# Patient Record
Sex: Female | Born: 1950 | Race: Black or African American | Hispanic: No | Marital: Married | Smoking: Never smoker
Health system: Southern US, Community
[De-identification: ages and names within clinical notes are randomized; demographics above are authoritative.]

## PROBLEM LIST (undated history)

## (undated) DIAGNOSIS — R413 Other amnesia: Secondary | ICD-10-CM

## (undated) DIAGNOSIS — S060X9A Concussion with loss of consciousness of unspecified duration, initial encounter: Secondary | ICD-10-CM

## (undated) DIAGNOSIS — G43909 Migraine, unspecified, not intractable, without status migrainosus: Secondary | ICD-10-CM

## (undated) DIAGNOSIS — S060XAA Concussion with loss of consciousness status unknown, initial encounter: Secondary | ICD-10-CM

## (undated) DIAGNOSIS — R51 Headache: Secondary | ICD-10-CM

## (undated) DIAGNOSIS — F32A Depression, unspecified: Secondary | ICD-10-CM

## (undated) DIAGNOSIS — E7800 Pure hypercholesterolemia, unspecified: Secondary | ICD-10-CM

## (undated) DIAGNOSIS — F329 Major depressive disorder, single episode, unspecified: Secondary | ICD-10-CM

## (undated) DIAGNOSIS — F0390 Unspecified dementia without behavioral disturbance: Secondary | ICD-10-CM

## (undated) HISTORY — DX: Pure hypercholesterolemia, unspecified: E78.00

## (undated) HISTORY — PX: KNEE SURGERY: SHX244

## (undated) HISTORY — PX: BACK SURGERY: SHX140

## (undated) HISTORY — DX: Major depressive disorder, single episode, unspecified: F32.9

## (undated) HISTORY — DX: Migraine, unspecified, not intractable, without status migrainosus: G43.909

## (undated) HISTORY — DX: Headache: R51

## (undated) HISTORY — DX: Unspecified dementia, unspecified severity, without behavioral disturbance, psychotic disturbance, mood disturbance, and anxiety: F03.90

## (undated) HISTORY — DX: Other amnesia: R41.3

## (undated) HISTORY — PX: HAND SURGERY: SHX662

## (undated) HISTORY — DX: Depression, unspecified: F32.A

---

## 1998-07-22 ENCOUNTER — Other Ambulatory Visit: Admission: RE | Admit: 1998-07-22 | Discharge: 1998-07-22 | Payer: Self-pay | Admitting: Obstetrics & Gynecology

## 1999-02-01 ENCOUNTER — Other Ambulatory Visit: Admission: RE | Admit: 1999-02-01 | Discharge: 1999-02-01 | Payer: Self-pay | Admitting: Obstetrics and Gynecology

## 1999-07-21 ENCOUNTER — Encounter: Payer: Self-pay | Admitting: Emergency Medicine

## 1999-07-21 ENCOUNTER — Emergency Department (HOSPITAL_COMMUNITY): Admission: EM | Admit: 1999-07-21 | Discharge: 1999-07-21 | Payer: Self-pay | Admitting: Emergency Medicine

## 1999-07-27 ENCOUNTER — Ambulatory Visit (HOSPITAL_BASED_OUTPATIENT_CLINIC_OR_DEPARTMENT_OTHER): Admission: RE | Admit: 1999-07-27 | Discharge: 1999-07-27 | Payer: Self-pay | Admitting: Orthopedic Surgery

## 1999-11-08 ENCOUNTER — Other Ambulatory Visit: Admission: RE | Admit: 1999-11-08 | Discharge: 1999-11-08 | Payer: Self-pay | Admitting: Obstetrics and Gynecology

## 1999-11-09 ENCOUNTER — Other Ambulatory Visit: Admission: RE | Admit: 1999-11-09 | Discharge: 1999-11-09 | Payer: Self-pay | Admitting: Obstetrics and Gynecology

## 1999-12-02 ENCOUNTER — Ambulatory Visit (HOSPITAL_COMMUNITY): Admission: RE | Admit: 1999-12-02 | Discharge: 1999-12-02 | Payer: Self-pay | Admitting: Obstetrics and Gynecology

## 1999-12-02 ENCOUNTER — Encounter: Payer: Self-pay | Admitting: Obstetrics and Gynecology

## 2000-08-28 ENCOUNTER — Encounter: Admission: RE | Admit: 2000-08-28 | Discharge: 2000-11-26 | Payer: Self-pay | Admitting: Anesthesiology

## 2000-10-19 ENCOUNTER — Emergency Department (HOSPITAL_COMMUNITY): Admission: EM | Admit: 2000-10-19 | Discharge: 2000-10-19 | Payer: Self-pay | Admitting: Internal Medicine

## 2000-11-04 ENCOUNTER — Emergency Department (HOSPITAL_COMMUNITY): Admission: EM | Admit: 2000-11-04 | Discharge: 2000-11-04 | Payer: Self-pay | Admitting: Emergency Medicine

## 2000-11-04 ENCOUNTER — Encounter: Payer: Self-pay | Admitting: Emergency Medicine

## 2000-12-26 ENCOUNTER — Encounter: Admission: RE | Admit: 2000-12-26 | Discharge: 2000-12-26 | Payer: Self-pay | Admitting: Occupational Medicine

## 2000-12-26 ENCOUNTER — Encounter: Payer: Self-pay | Admitting: Occupational Medicine

## 2001-01-12 ENCOUNTER — Emergency Department (HOSPITAL_COMMUNITY): Admission: EM | Admit: 2001-01-12 | Discharge: 2001-01-12 | Payer: Self-pay | Admitting: Emergency Medicine

## 2001-01-12 ENCOUNTER — Encounter: Payer: Self-pay | Admitting: Emergency Medicine

## 2001-02-26 ENCOUNTER — Encounter: Admission: RE | Admit: 2001-02-26 | Discharge: 2001-02-26 | Payer: Self-pay | Admitting: Obstetrics & Gynecology

## 2001-02-28 ENCOUNTER — Encounter: Admission: RE | Admit: 2001-02-28 | Discharge: 2001-05-02 | Payer: Self-pay | Admitting: Family Medicine

## 2002-04-11 ENCOUNTER — Encounter: Payer: Self-pay | Admitting: Emergency Medicine

## 2002-04-11 ENCOUNTER — Emergency Department (HOSPITAL_COMMUNITY): Admission: EM | Admit: 2002-04-11 | Discharge: 2002-04-11 | Payer: Self-pay | Admitting: Emergency Medicine

## 2002-05-18 ENCOUNTER — Ambulatory Visit (HOSPITAL_COMMUNITY)
Admission: RE | Admit: 2002-05-18 | Discharge: 2002-05-18 | Payer: Self-pay | Admitting: Physical Medicine and Rehabilitation

## 2002-05-18 ENCOUNTER — Encounter: Payer: Self-pay | Admitting: Physical Medicine and Rehabilitation

## 2002-06-10 ENCOUNTER — Emergency Department (HOSPITAL_COMMUNITY): Admission: EM | Admit: 2002-06-10 | Discharge: 2002-06-10 | Payer: Self-pay | Admitting: Emergency Medicine

## 2004-08-19 ENCOUNTER — Encounter: Admission: RE | Admit: 2004-08-19 | Discharge: 2004-08-19 | Payer: Self-pay | Admitting: Orthopaedic Surgery

## 2004-10-17 ENCOUNTER — Other Ambulatory Visit: Admission: RE | Admit: 2004-10-17 | Discharge: 2004-10-17 | Payer: Self-pay | Admitting: Family Medicine

## 2004-10-20 ENCOUNTER — Ambulatory Visit (HOSPITAL_COMMUNITY): Admission: RE | Admit: 2004-10-20 | Discharge: 2004-10-20 | Payer: Self-pay | Admitting: Family Medicine

## 2004-10-31 ENCOUNTER — Encounter: Admission: RE | Admit: 2004-10-31 | Discharge: 2005-01-29 | Payer: Self-pay | Admitting: Family Medicine

## 2006-01-22 ENCOUNTER — Emergency Department (HOSPITAL_COMMUNITY): Admission: EM | Admit: 2006-01-22 | Discharge: 2006-01-22 | Payer: Self-pay | Admitting: Emergency Medicine

## 2006-06-23 ENCOUNTER — Emergency Department (HOSPITAL_COMMUNITY): Admission: EM | Admit: 2006-06-23 | Discharge: 2006-06-23 | Payer: Self-pay | Admitting: Emergency Medicine

## 2007-01-12 ENCOUNTER — Emergency Department (HOSPITAL_COMMUNITY): Admission: EM | Admit: 2007-01-12 | Discharge: 2007-01-12 | Payer: Self-pay | Admitting: Family Medicine

## 2007-02-13 ENCOUNTER — Encounter: Admission: RE | Admit: 2007-02-13 | Discharge: 2007-02-13 | Payer: Self-pay | Admitting: Specialist

## 2008-12-03 ENCOUNTER — Emergency Department (HOSPITAL_COMMUNITY): Admission: EM | Admit: 2008-12-03 | Discharge: 2008-12-03 | Payer: Self-pay | Admitting: Emergency Medicine

## 2008-12-07 ENCOUNTER — Emergency Department (HOSPITAL_COMMUNITY): Admission: EM | Admit: 2008-12-07 | Discharge: 2008-12-07 | Payer: Self-pay | Admitting: Emergency Medicine

## 2008-12-16 ENCOUNTER — Emergency Department (HOSPITAL_COMMUNITY): Admission: EM | Admit: 2008-12-16 | Discharge: 2008-12-17 | Payer: Self-pay | Admitting: Emergency Medicine

## 2010-10-28 NOTE — Op Note (Signed)
[facility]  Patient:    GENEVEIVE, FURNESS                       MRN: [id number] Proc. Date: 09/05/00 Adm. Date:  [id number] Attending:  Thyra Breed CC:         Garlon Hatchet.   Operative Report  PROCEDURE:  Lumbar epidural steroid injection.  DIAGNOSIS:  Lumbar degenerative disk disease.  INTERVAL HISTORY:  The patient has noted some lessening of her leg discomfort after her first injection, but continues to have a pain level of 5 out of 10. She had no untoward effects from the injection.  EXAMINATION:  VITAL SIGNS:  Blood pressure 143/69, heart rate 94, respiratory rate 15, O2 saturations 99%, pain level 5 out of 10.  BACK:  Shows good healing from a previous injection site.  DESCRIPTION OF PROCEDURE:  After an informed consent was obtained, the patient was placed in the sitting position and monitored.  Her back was prepped with Betadine x 3.  A skin wheal was raised at the L3-4 interspace with 1% lidocaine.  A 20-gauge Tuohy needle was introduced into the lumbar epidural space to loss of resistance to preservative-free normal saline.  There was no cerebral spinal fluid nor blood.  Then 40 mg of Medrol and 8 mL of preservative-free normal saline was gently injected.  The needle was flushed with preservative-free normal saline and removed intact.  POSTPROCEDURE CONDITION:  Stable.  DISCHARGE INSTRUCTIONS: 1. Resume previous diet. 2. Limitation in activities per instruction sheet. 3. Continue with current medications. 4. Follow up in one to two weeks for repeat injection. DD:  09/05/00 TD:  09/06/00 Job: [id number]

## 2010-10-28 NOTE — Op Note (Signed)
[place]  Patient:    Rhonda Peterson, Rhonda Peterson                         MRN: [id number] Proc. Date: 07/27/99 Adm. Date:  07/27/99 Attending:  Artist Pais. Mina Marble, M.D.                           Operative Report  PREOPERATIVE DIAGNOSIS:  Displaced fracture of left fifth finger proximal phalanx.  POSTOPERATIVE DIAGNOSIS:  Displaced fracture of left fifth finger proximal phalanx.  PROCEDURE:  Closed reduction and percutaneous pinning of above fracture.  SURGEON:  Artist Pais. Mina Marble, M.D.  ANESTHESIA:  General.  TOURNIQUET TIME:  Fifteen minutes.  COMPLICATIONS:  None.  DRAINS:  None.  DESCRIPTION OF PROCEDURE:  The patient was taken to the operating room and after the induction of adequate general anesthesia, the left upper extremity was prepped and draped in the usual sterile fashion.  Esmarch was used to exsanguinate the limb, and the tourniquet was inflated to 250 mmHg.  At this point in time, a closed reduction was performed with displaced fracture at the base of the fifth proximal phalanx, and under fluoroscopic guidance, two 0.035 K-wires were driven from proximal to distal in a cross fashion across the fracture site, and to maintain the reduction.  The reduction was deemed to be adequate in both AP, lateral, and oblique view.  The wires were cut outside the skin and bent upon themselves.  At this point in time, a sterile dressing of Xeroform, 4 x 4s, fluffs, and an ulnar gutter splint was applied.  The patient tolerated the procedure well and went to the recovery room in a stable fashion. DD:  10/19/99 TD:  10/21/99 Job: [id number]
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
## 2010-10-28 NOTE — Procedures (Signed)
[facility]  Patient:    Rhonda Peterson, Rhonda Peterson                       MRN: [id number] Proc. Date: 09/12/00 Adm. Date:  [id number] Attending:  Thyra Breed CC:         Donzetta Sprung. Roney Jaffe., MD                           Procedure Report  PROCEDURE PERFORMED:  Lumbar epidural steroid injection.  DIAGNOSIS:  Lumbar degenerative disk disease.  BRIEF HISTORY:  The patient has noted that her pain has progressively improved.  She rates it as 3/10.  She has had intermittent radiation of her pain out to her left foot, which comes and goes fairly infrequently.  She is here for her third injection today, which she is interested in pursuing.  PHYSICAL EXAMINATION:  Blood pressure 113/53, heart rate 105, and respiratory rate 18.  Her O2 sat is 99%.  Temperature is 97.9.  Pain level is 3/10.  Her back shows good healing from a previous injection site.  DESCRIPTION OF PROCEDURE:  After informed consent was obtained, the patient was placed in the sitting position and monitored.  Her back was prepped with Betadine x 3.  A skin wheel was raised at the L4-5 interspace with 1% lidocaine.  A 20-gauge Tuohy needle was introduced to the lumbar epidural space to loss of resistance to preservative-free normal saline.  There was no CSF or blood.  Medrol 40 mg and 8 ml of preservative-free normal saline was gently injected.  The needle was flushed with preservative free of normal saline and removed intact.  Post procedure condition is stable.  PLAN: 1. Resume previous diet. 2. Limitation activities per instruction sheet as outlined by    my assistant today. 3. Continue current medications. 4. The patient plans to follow up with Dr. Donalee Citrin. DD:  09/12/00 TD:  09/12/00 Job: [id number]

## 2010-10-28 NOTE — Op Note (Signed)
[place]  Patient:    Rhonda Peterson, Rhonda Peterson                         MRN: [id number] Proc. Date: 07/27/99 Attending:  Artist Pais. Mina Marble, M.D. CC:         Artist Pais. Weingold, M.D. x 2   Operative Report  PREOPERATIVE DIAGNOSIS:  Fracture left fifth proximal phalanx.  POSTOPERATIVE DIAGNOSIS:  Fracture left fifth proximal phalanx.  PROCEDURE:  Closed reduction and percutaneous fixation of above fracture.  SURGEON:  Artist Pais. Mina Marble, M.D.  ANESTHESIA:  General anesthesia.  TOURNIQUET TIME:  21 minutes.  COMPLICATIONS:  None.  DRAINS:  None.  DESCRIPTION OF PROCEDURE:  The patient was taken to the operating room and after the induction of adequate general anesthesia, the left upper extremity was prepped and draped in the usual sterile fashion.  Esmarch was used to exsanguinate the limb and the tourniquet was inflated to 250 mmHg.  At this point a closed reduction was performed with the displaced fifth proximal phalangeal fracture at the base.  Once adequate reduction was obtained, two 0.35 K-wires were driven from the collateral ligament recesses of the metacarpal phalangeal joint across the proximal fragment into the distal fragment in an X-type fashion.  Intraoperative x-ray showed adequate reduction in both the AP and lateral planes.  The pins were then bent outside the skin and the patient was then dressed in a sterile ulnar gutter splint.  The patient tolerated the procedure well and went to the recovery room in stable condition. DD:  02/01/00 TD:  02/01/00 Job: [id number]

## 2011-09-25 ENCOUNTER — Emergency Department (HOSPITAL_COMMUNITY)
Admission: EM | Admit: 2011-09-25 | Discharge: 2011-09-26 | Disposition: A | Discharge status: Home / Self Care | Payer: Self-pay | Attending: Emergency Medicine | Admitting: Emergency Medicine

## 2011-09-25 ENCOUNTER — Encounter (HOSPITAL_COMMUNITY): Payer: Self-pay | Admitting: Emergency Medicine

## 2011-09-25 DIAGNOSIS — S060X0A Concussion without loss of consciousness, initial encounter: Secondary | ICD-10-CM | POA: Insufficient documentation

## 2011-09-25 DIAGNOSIS — S0003XA Contusion of scalp, initial encounter: Secondary | ICD-10-CM | POA: Insufficient documentation

## 2011-09-25 DIAGNOSIS — R6884 Jaw pain: Secondary | ICD-10-CM | POA: Insufficient documentation

## 2011-09-25 DIAGNOSIS — R51 Headache: Secondary | ICD-10-CM | POA: Insufficient documentation

## 2011-09-25 DIAGNOSIS — M542 Cervicalgia: Secondary | ICD-10-CM | POA: Insufficient documentation

## 2011-09-25 DIAGNOSIS — S0083XA Contusion of other part of head, initial encounter: Secondary | ICD-10-CM | POA: Insufficient documentation
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
## 2011-09-26 ENCOUNTER — Emergency Department (HOSPITAL_COMMUNITY): Payer: Self-pay
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
## 2012-01-11 ENCOUNTER — Emergency Department (HOSPITAL_COMMUNITY): Payer: Self-pay

## 2012-01-11 ENCOUNTER — Emergency Department (HOSPITAL_COMMUNITY)
Admission: EM | Admit: 2012-01-11 | Discharge: 2012-01-12 | Disposition: A | Discharge status: Home / Self Care | Payer: Self-pay | Attending: Emergency Medicine | Admitting: Emergency Medicine

## 2012-01-11 ENCOUNTER — Encounter (HOSPITAL_COMMUNITY): Payer: Self-pay | Admitting: Adult Health

## 2012-01-11 DIAGNOSIS — IMO0001 Reserved for inherently not codable concepts without codable children: Secondary | ICD-10-CM | POA: Insufficient documentation

## 2012-01-11 DIAGNOSIS — F0781 Postconcussional syndrome: Secondary | ICD-10-CM | POA: Insufficient documentation

## 2012-01-11 DIAGNOSIS — G44309 Post-traumatic headache, unspecified, not intractable: Secondary | ICD-10-CM | POA: Insufficient documentation

## 2012-01-11 DIAGNOSIS — R4182 Altered mental status, unspecified: Secondary | ICD-10-CM | POA: Insufficient documentation

## 2012-01-11 HISTORY — DX: Concussion with loss of consciousness of unspecified duration, initial encounter: S06.0X9A

## 2012-01-11 HISTORY — DX: Concussion with loss of consciousness status unknown, initial encounter: S06.0XAA

## 2012-01-11 LAB — URINALYSIS, ROUTINE W REFLEX MICROSCOPIC
Bilirubin Urine: NEGATIVE
Glucose, UA: NEGATIVE mg/dL
Hgb urine dipstick: NEGATIVE
Protein, ur: NEGATIVE mg/dL

## 2012-01-11 LAB — COMPREHENSIVE METABOLIC PANEL
AST: 20 U/L (ref 0–37)
Albumin: 3.7 g/dL (ref 3.5–5.2)
Calcium: 9.5 mg/dL (ref 8.4–10.5)
Creatinine, Ser: 0.65 mg/dL (ref 0.50–1.10)
GFR calc non Af Amer: >90 mL/min (ref >90)
Sodium: 140 mEq/L (ref 135–145)
Total Protein: 7.5 g/dL (ref 6.0–8.3)

## 2012-01-11 LAB — CBC
MCH: 29.4 pg (ref 26.0–34.0)
MCHC: 32.5 g/dL (ref 30.0–36.0)
MCV: 90.4 fL (ref 78.0–100.0)
Platelets: 260 10*3/uL (ref 150–400)
RDW: 12.8 % (ref 11.5–15.5)

## 2012-01-11 LAB — RAPID URINE DRUG SCREEN, HOSP PERFORMED
Amphetamines: NOT DETECTED
Benzodiazepines: NOT DETECTED
Cocaine: NOT DETECTED
Opiates: NOT DETECTED

## 2012-01-11 LAB — ACETAMINOPHEN LEVEL: Acetaminophen (Tylenol), Serum: <15 ug/mL (ref 10–30)

## 2012-01-11 LAB — GLUCOSE, CAPILLARY: Glucose-Capillary: 71 mg/dL (ref 70–99)

## 2012-01-11 MED ORDER — SODIUM CHLORIDE 0.9 % IV BOLUS (SEPSIS): 1000.0000 mL | Freq: Once | INTRAVENOUS | Status: DC

## 2012-01-11 MED ORDER — DIPHENHYDRAMINE HCL 50 MG/ML IJ SOLN: 25.0000 mg | Freq: Once | INTRAMUSCULAR | Status: DC

## 2012-01-11 NOTE — ED Notes (Signed)
Patient transported to CT 
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
## 2012-01-12 NOTE — ED Provider Notes (Signed)
I saw and evaluated the patient, reviewed the resident's note and I agree with the findings and plan.   Dione Booze, MD 01/12/12 [phone]

## 2012-01-12 NOTE — ED Notes (Signed)
The patient is AOx4 and comfortable with her discharge instructions. 

## 2012-05-20 ENCOUNTER — Emergency Department (HOSPITAL_COMMUNITY)
Admission: EM | Admit: 2012-05-20 | Discharge: 2012-05-20 | Discharge status: Absent without leave | Payer: Self-pay | Source: Home / Self Care

## 2012-10-03 ENCOUNTER — Emergency Department (HOSPITAL_COMMUNITY)
Admission: EM | Admit: 2012-10-03 | Discharge: 2012-10-04 | Disposition: A | Discharge status: Home / Self Care | Payer: Medicare Other | Attending: Emergency Medicine | Admitting: Emergency Medicine

## 2012-10-03 ENCOUNTER — Encounter (HOSPITAL_COMMUNITY): Payer: Self-pay | Admitting: Emergency Medicine

## 2012-10-03 DIAGNOSIS — F430 Acute stress reaction: Secondary | ICD-10-CM | POA: Insufficient documentation

## 2012-10-03 DIAGNOSIS — Z87828 Personal history of other (healed) physical injury and trauma: Secondary | ICD-10-CM | POA: Insufficient documentation

## 2012-10-03 DIAGNOSIS — Z659 Problem related to unspecified psychosocial circumstances: Secondary | ICD-10-CM

## 2012-10-03 DIAGNOSIS — F329 Major depressive disorder, single episode, unspecified: Secondary | ICD-10-CM

## 2012-10-03 DIAGNOSIS — F3289 Other specified depressive episodes: Secondary | ICD-10-CM | POA: Insufficient documentation
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
## 2012-10-04 NOTE — ED Provider Notes (Signed)
Medical screening examination/treatment/procedure(s) were performed by non-physician practitioner and as supervising physician I was immediately available for consultation/collaboration.   Carleene Cooper III, MD 10/04/12 Rosamaria Lints

## 2012-10-04 NOTE — ED Notes (Signed)
Pt discharged.Vital signs stable and GCS 15 

## 2012-10-14 ENCOUNTER — Emergency Department (HOSPITAL_COMMUNITY): Payer: Medicare Other

## 2012-10-14 ENCOUNTER — Encounter (HOSPITAL_COMMUNITY): Payer: Self-pay | Admitting: Cardiology

## 2012-10-14 ENCOUNTER — Emergency Department (HOSPITAL_COMMUNITY)
Admission: EM | Admit: 2012-10-14 | Discharge: 2012-10-14 | Disposition: A | Discharge status: Home / Self Care | Payer: Medicare Other | Attending: Emergency Medicine | Admitting: Emergency Medicine

## 2012-10-14 DIAGNOSIS — F4389 Other reactions to severe stress: Secondary | ICD-10-CM | POA: Insufficient documentation

## 2012-10-14 DIAGNOSIS — R079 Chest pain, unspecified: Secondary | ICD-10-CM

## 2012-10-14 DIAGNOSIS — F419 Anxiety disorder, unspecified: Secondary | ICD-10-CM

## 2012-10-14 DIAGNOSIS — R0789 Other chest pain: Secondary | ICD-10-CM | POA: Insufficient documentation

## 2012-10-14 DIAGNOSIS — F439 Reaction to severe stress, unspecified: Secondary | ICD-10-CM

## 2012-10-14 DIAGNOSIS — F411 Generalized anxiety disorder: Secondary | ICD-10-CM | POA: Insufficient documentation

## 2012-10-14 DIAGNOSIS — F438 Other reactions to severe stress: Secondary | ICD-10-CM | POA: Insufficient documentation

## 2012-10-14 DIAGNOSIS — R51 Headache: Secondary | ICD-10-CM

## 2012-10-14 LAB — CBC
MCHC: 32.6 g/dL (ref 30.0–36.0)
Platelets: 272 10*3/uL (ref 150–400)
RDW: 12.5 % (ref 11.5–15.5)
WBC: 3.8 10*3/uL — ABNORMAL LOW (ref 4.0–10.5)

## 2012-10-14 LAB — BASIC METABOLIC PANEL
BUN: 10 mg/dL (ref 6–23)
Chloride: 100 mEq/L (ref 96–112)
GFR calc Af Amer: >90 mL/min (ref >90)
GFR calc non Af Amer: >90 mL/min (ref >90)
Potassium: 3.7 mEq/L (ref 3.5–5.1)
Sodium: 139 mEq/L (ref 135–145)

## 2012-10-14 LAB — POCT I-STAT TROPONIN I: Troponin i, poc: 0 ng/mL (ref 0.00–0.08)

## 2012-10-14 NOTE — ED Notes (Signed)
Pt reports that she has had increased stress over the past couple of months. Pt c/o headache and a "funny feeling" in her chest. Denies any SOB or n/v. Also denies any vision changes but reports a pressure in her head.
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
## 2012-10-14 NOTE — ED Provider Notes (Signed)
Medical screening examination/treatment/procedure(s) were performed by non-physician practitioner and as supervising physician I was immediately available for consultation/collaboration.    Celene Kras, MD 10/14/12 [phone]

## 2012-11-05 ENCOUNTER — Emergency Department (HOSPITAL_COMMUNITY)
Admission: EM | Admit: 2012-11-05 | Discharge: 2012-11-05 | Disposition: A | Discharge status: Home / Self Care | Payer: Medicare Other | Attending: Emergency Medicine | Admitting: Emergency Medicine

## 2012-11-05 ENCOUNTER — Encounter (HOSPITAL_COMMUNITY): Payer: Self-pay | Admitting: Emergency Medicine

## 2012-11-05 DIAGNOSIS — R443 Hallucinations, unspecified: Secondary | ICD-10-CM | POA: Insufficient documentation

## 2012-11-05 DIAGNOSIS — F4320 Adjustment disorder, unspecified: Secondary | ICD-10-CM

## 2012-11-05 DIAGNOSIS — Z9119 Patient's noncompliance with other medical treatment and regimen: Secondary | ICD-10-CM | POA: Insufficient documentation

## 2012-11-05 DIAGNOSIS — Z91199 Patient's noncompliance with other medical treatment and regimen due to unspecified reason: Secondary | ICD-10-CM | POA: Insufficient documentation

## 2012-11-05 DIAGNOSIS — Z0289 Encounter for other administrative examinations: Secondary | ICD-10-CM | POA: Insufficient documentation

## 2012-11-05 LAB — COMPREHENSIVE METABOLIC PANEL
ALT: 15 U/L (ref 0–35)
AST: 19 U/L (ref 0–37)
Albumin: 3.9 g/dL (ref 3.5–5.2)
Chloride: 101 mEq/L (ref 96–112)
Creatinine, Ser: 0.63 mg/dL (ref 0.50–1.10)
Sodium: 138 mEq/L (ref 135–145)
Total Bilirubin: 0.6 mg/dL (ref 0.3–1.2)

## 2012-11-05 LAB — CBC
MCV: 90 fL (ref 78.0–100.0)
Platelets: 297 10*3/uL (ref 150–400)
RBC: 3.99 MIL/uL (ref 3.87–5.11)
RDW: 12.3 % (ref 11.5–15.5)
WBC: 5.3 10*3/uL (ref 4.0–10.5)

## 2012-11-05 LAB — RAPID URINE DRUG SCREEN, HOSP PERFORMED
Amphetamines: NOT DETECTED
Barbiturates: NOT DETECTED
Cocaine: NOT DETECTED
Opiates: NOT DETECTED
Tetrahydrocannabinol: NOT DETECTED

## 2012-11-05 MED ORDER — LORAZEPAM 1 MG PO TABS: 1.0000 mg | ORAL_TABLET | Freq: Three times a day (TID) | ORAL | Status: DC | PRN

## 2012-11-05 MED ORDER — ONDANSETRON HCL 4 MG PO TABS: 4.0000 mg | ORAL_TABLET | Freq: Three times a day (TID) | ORAL | Status: DC | PRN

## 2012-11-05 MED ORDER — ALUM & MAG HYDROXIDE-SIMETH 200-200-20 MG/5ML PO SUSP: 30.0000 mL | ORAL | Status: DC | PRN

## 2012-11-05 MED ORDER — NICOTINE 21 MG/24HR TD PT24: 21.0000 mg | MEDICATED_PATCH | Freq: Every day | TRANSDERMAL | Status: DC

## 2012-11-05 MED ORDER — ZOLPIDEM TARTRATE 5 MG PO TABS: 5.0000 mg | ORAL_TABLET | Freq: Every evening | ORAL | Status: DC | PRN

## 2012-11-05 MED ORDER — IBUPROFEN 600 MG PO TABS: 600.0000 mg | ORAL_TABLET | Freq: Three times a day (TID) | ORAL | Status: DC | PRN
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
## 2012-11-05 NOTE — ED Provider Notes (Signed)
Medical screening examination/treatment/procedure(s) were performed by non-physician practitioner and as supervising physician I was immediately available for consultation/collaboration.  Carden Teel J. Aulden Calise, MD 11/05/12 1537 

## 2012-11-05 NOTE — [facility] Counselor (Signed)
Discharge per telepsych; referrals for outpatient programs given.

## 2012-11-05 NOTE — ED Notes (Signed)
telepsych consult being performed
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
## 2012-11-05 NOTE — ED Notes (Signed)
Patient denies SI/HI and A/V hallucinations; patient is tangential; patient is very focused on the fact that she is healthy; patient focuses on her daughter and her daughter wanting some of her things and cursing at her and different disagreements; patient states " i dont know why im here"
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
## 2012-11-07 ENCOUNTER — Ambulatory Visit: Payer: Medicare Other | Attending: Family Medicine | Admitting: Internal Medicine

## 2012-11-07 VITALS — BP 129/84 | HR 77 | Temp 98.3°F | Resp 16 | Wt 161.0 lb

## 2012-11-07 DIAGNOSIS — F0391 Unspecified dementia with behavioral disturbance: Secondary | ICD-10-CM

## 2012-11-07 DIAGNOSIS — R6889 Other general symptoms and signs: Secondary | ICD-10-CM

## 2012-11-07 DIAGNOSIS — F430 Acute stress reaction: Secondary | ICD-10-CM | POA: Insufficient documentation

## 2012-11-07 DIAGNOSIS — R413 Other amnesia: Secondary | ICD-10-CM | POA: Insufficient documentation

## 2012-11-07 DIAGNOSIS — F329 Major depressive disorder, single episode, unspecified: Secondary | ICD-10-CM

## 2012-11-07 DIAGNOSIS — F32A Depression, unspecified: Secondary | ICD-10-CM | POA: Insufficient documentation

## 2012-11-07 LAB — TSH: TSH: 0.428 u[IU]/mL (ref 0.350–4.500)

## 2012-11-07 NOTE — Progress Notes (Signed)
Patient here to establish care No allergies  No prescribed medications Needs referral to neurology
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
## 2012-11-15 ENCOUNTER — Ambulatory Visit: Payer: Medicare Other | Attending: Family Medicine | Admitting: Family Medicine

## 2012-11-15 VITALS — BP 129/81 | HR 74 | Temp 98.1°F | Resp 16 | Ht 64.0 in | Wt 162.0 lb

## 2012-11-15 DIAGNOSIS — F3289 Other specified depressive episodes: Secondary | ICD-10-CM

## 2012-11-15 DIAGNOSIS — F329 Major depressive disorder, single episode, unspecified: Secondary | ICD-10-CM

## 2012-11-15 DIAGNOSIS — F4310 Post-traumatic stress disorder, unspecified: Secondary | ICD-10-CM

## 2012-11-15 DIAGNOSIS — Z1331 Encounter for screening for depression: Secondary | ICD-10-CM

## 2012-11-15 DIAGNOSIS — Z5189 Encounter for other specified aftercare: Secondary | ICD-10-CM

## 2012-11-15 DIAGNOSIS — T7491XD Unspecified adult maltreatment, confirmed, subsequent encounter: Secondary | ICD-10-CM
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
## 2012-11-15 NOTE — Patient Instructions (Addendum)
Post-Traumatic Stress Disorder If you have been diagnosed with post-traumatic stress disorder (PTSD), you have probably experienced a traumatic event in your life. These events are usually outside of the range of normal human experience and would negatively impact any normal person.  CAUSES  A person can get PTSD after living through or seeing a dangerous event such as:  An automobile accident.  War.  Natural disaster.  Rape.  Domestic violence.  Any event where there has been a threat to life. PTSD is a real illness. PTSD Can happen to anyone at any age. Children get PTSD too. A doctor, or mental health professional with experience in treating PTSD can help you. SYMPTOMS  Not all symptoms may be present in any one person.  Distressing dreams.  Flashback: feeling the frightening event is happening again.  Avoiding activities, places, and people that remind you of the event.  Avoiding thoughts and feelings associated with the event.  Having frightening thoughts you cannot control.  Feeling on the edge with increased alertness and vigilance.  Trouble sleeping.  Feeling alone, detached from others.  Angry outbursts.  Feeling worried, guilty, or sad.  Having thoughts of hurting yourself or others. PTSD may start soon after a frightening event or months or years later. Many war veterans have PTSD. Drinking alcohol or using drugs will not help PTSD and may even make it worse.  TREATMENT  PTSD can be treated. Treatment may include "talk" therapy, medicine, or both. Either a doctor or a mental health professional who is experienced in treating PTSD can help you. Early diagnosis and treatment is best and can show more rapid improvement. Get help if you or a loved one are thinking of hurting yourself. Call your local emergency medical services if you need help immediately. Document Released: 02/21/2001 Document Revised: 08/21/2011 Document Reviewed: 02/05/2008 [facility] Patient  Information 2014 [place], [place].  If You Are the Victim of Domestic Violence THE POLICE CAN HELP YOU:  Get to a safe place away from the violence.  Get information on how the court can help protect you against the violence.  Get medical care for injuries you or your children may have.  Get necessary belongings from your home for you and your children.  Get copies of police reports about the violence.  File a complaint in criminal court.  Find where local criminal and family courts are located. THE COURTS CAN HELP YOU  If the person who harmed or threatened you is a family member or someone you have had a child with, then you have the right to take your case to the criminal courts, the [employer], or both.  If you and the abuser are not related, were not ever married, and do not have a child in common, then your case can be heard only in the criminal court.  The forms you need are available from the [facility] and the criminal court.  The courts can decide to provide a temporary order of protection for:  You.  Your children.  Any witnesses who may request one.  The [employer] may appoint a lawyer to help you in court if it is found that you cannot afford one.  The Family Court may order temporary child support and temporary custody of your children. LAWS VARY FROM STATE TO STATE. YOU WILL NEED TO CHECK THE LAWS IN YOUR STATE.  You may request that the law enforcement officer assist in:  Providing for your safety and that of your children. This  includes providing information on how to obtain a temporary order of protection.  Obtaining essential personal property.  Locating and taking you and your children to a safe place within the officer's jurisdiction. This includes but is not limited to a domestic violence program, a family member's or a friend's residence, or a similar place of safety.  Obtaining medical treatment for you and your children.  When the  officer's jurisdiction is more than a single county, you may ask the officer to take you or make arrangements to take you and your children to a place of safety in the county where the incident occurred.  You may request a copy of any incident reports at no cost from the law enforcement agency.  You have the right to seek legal counsel of your own choosing. If you proceed in family court and if it is determined that you cannot afford an attorney one must be appointed to represent you without cost to you.  You may ask the district attorney or a [employer] to file a criminal complaint. You also have the right to have your petition and request for an order of protection filed on the same day you appear in court. Such request must be heard that same day or the next day court is in session.  Either court may issue an order of protection from conduct constituting a family offense. This could include an order for the respondent or defendant to stay away from you and your children.  If the family court is not in session, you may seek immediate assistance from the criminal court in obtaining an order of protection. The forms you need to obtain an order of protection are available from the family court and the local criminal court. Note that filing a criminal complaint or a family court petition containing allegations (claims) that are knowingly false is a crime. Call your local domestic violence program for additional information and support. Document Released: 08/19/2003 Document Revised: 08/21/2011 Document Reviewed: 04/08/2007 [facility] Patient Information 2014 [place], [place].  Domestic Abuse Women are usually the battered ones in a relationship, although men can be battered as well. One woman in 5 is battered by their female partners at least once. Many times these episodes are related to drinking too much or taking drugs. No matter what was said or done, no one deserves to be abused. This is a  very painful and controlling situation. Taking action to save yourself from further abuse is not always easy. You have a right to:  Shelter - There are organizations and temporary residences in most areas where you can get some protection and help.  Counseling - The facts must be faced and the situation changed. Contact a counselor who works with abused men or women.  Legal Aid - Sometimes legal action is needed to protect yourself and your children. Bring your abusing partner for the help they need. We are here to treat your injuries, and to help prevent this from happening again. Contact the authorities if you think your life is in danger. A court protection order can be obtained, or you and your children can go to a safe house. Contact your local Domestic Violence Program or website http://ewing.com/ for further information and support. Document Released: 05/29/2005 Document Revised: 08/21/2011 Document Reviewed: 11/17/2006 [facility] Patient Information 2014 [place], [place].  Depression, Adult Depression is feeling sad, low, down in the dumps, blue, gloomy, or empty. In general, there are two kinds of depression:  Normal sadness or  grief. This can happen after something upsetting. It often goes away on its own within 2 weeks. After losing a loved one (bereavement), normal sadness and grief may last longer than two weeks. It usually gets better with time.  Clinical depression. This kind lasts longer than normal sadness or grief. It keeps you from doing the things you normally do in life. It is often hard to function at home, work, or at school. It may affect your relationships with others. Treatment is often needed. GET HELP RIGHT AWAY IF:  You have thoughts about hurting yourself or others.  You lose touch with reality (psychotic symptoms). You may:  See or hear things that are not real.  Have untrue beliefs about your life or people around you.  Your medicine is giving you problems. MAKE  SURE YOU:  Understand these instructions.  Will watch your condition.  Will get help right away if you are not doing well or get worse. Document Released: 07/01/2010 Document Revised: 02/21/2012 Document Reviewed: 07/01/2010 [facility] Patient Information 2014 [place], [place].
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
## 2012-11-18 ENCOUNTER — Ambulatory Visit: Payer: Medicare Other | Attending: Family Medicine | Admitting: Family Medicine

## 2012-11-18 ENCOUNTER — Ambulatory Visit: Payer: Medicare Other

## 2012-11-18 VITALS — BP 161/101 | HR 84 | Temp 98.0°F | Resp 18

## 2012-11-18 DIAGNOSIS — F4310 Post-traumatic stress disorder, unspecified: Secondary | ICD-10-CM

## 2012-11-18 DIAGNOSIS — F3289 Other specified depressive episodes: Secondary | ICD-10-CM | POA: Insufficient documentation

## 2012-11-18 DIAGNOSIS — Z09 Encounter for follow-up examination after completed treatment for conditions other than malignant neoplasm: Secondary | ICD-10-CM | POA: Insufficient documentation

## 2012-11-18 DIAGNOSIS — R6889 Other general symptoms and signs: Secondary | ICD-10-CM

## 2012-11-18 DIAGNOSIS — F329 Major depressive disorder, single episode, unspecified: Secondary | ICD-10-CM
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
## 2012-11-18 NOTE — Patient Instructions (Addendum)
Post-Traumatic Stress Disorder If you have been diagnosed with post-traumatic stress disorder (PTSD), you have probably experienced a traumatic event in your life. These events are usually outside of the range of normal human experience and would negatively impact any normal person.  CAUSES  A person can get PTSD after living through or seeing a dangerous event such as:  An automobile accident.  War.  Natural disaster.  Rape.  Domestic violence.  Any event where there has been a threat to life. PTSD is a real illness. PTSD Can happen to anyone at any age. Children get PTSD too. A doctor, or mental health professional with experience in treating PTSD can help you. SYMPTOMS  Not all symptoms may be present in any one person.  Distressing dreams.  Flashback: feeling the frightening event is happening again.  Avoiding activities, places, and people that remind you of the event.  Avoiding thoughts and feelings associated with the event.  Having frightening thoughts you cannot control.  Feeling on the edge with increased alertness and vigilance.  Trouble sleeping.  Feeling alone, detached from others.  Angry outbursts.  Feeling worried, guilty, or sad.  Having thoughts of hurting yourself or others. PTSD may start soon after a frightening event or months or years later. Many war veterans have PTSD. Drinking alcohol or using drugs will not help PTSD and may even make it worse.  TREATMENT  PTSD can be treated. Treatment may include "talk" therapy, medicine, or both. Either a doctor or a mental health professional who is experienced in treating PTSD can help you. Early diagnosis and treatment is best and can show more rapid improvement. Get help if you or a loved one are thinking of hurting yourself. Call your local emergency medical services if you need help immediately. Document Released: 02/21/2001 Document Revised: 08/21/2011 Document Reviewed: 02/05/2008 [employer] Patient  Information 2014 [employer].  

## 2012-11-18 NOTE — Progress Notes (Signed)
Patient ID: Rhonda Peterson, female   DOB: [birth year], 62 y.o.   MRN: [id number]  CC: follow up   HPI: The patient was seen here 3 days ago and now presents with daughter.  Daughter is frustrated with patient because of her erratic behavior and forgetfulness.  She has been causing a lot of confusion at home because she becomes accusatory about her things and it is difficult on the entire family.   No Known Allergies Past Medical History  Diagnosis Date  . Concussion   . Depression    Current Outpatient Prescriptions on File Prior to Visit  Medication Sig Dispense Refill  . Multiple Vitamins-Minerals (HAIR VITAMINS PO) Take 1 tablet by mouth daily.       No current facility-administered medications on file prior to visit.   History reviewed. No pertinent family history. History   Social History  . Marital Status: Married    Spouse Name: N/A    Number of Children: N/A  . Years of Education: N/A   Occupational History  . Not on file.   Social History Main Topics  . Smoking status: Never Smoker   . Smokeless tobacco: Not on file  . Alcohol Use: No  . Drug Use: No  . Sexually Active: Not on file   Other Topics Concern  . Not on file   Social History Narrative  . No narrative on file    Review of Systems  Constitutional: Negative for fever, chills, diaphoresis, activity change, appetite change and fatigue.  HENT: Negative for ear pain, nosebleeds, congestion, facial swelling, rhinorrhea, neck pain, neck stiffness and ear discharge.   Eyes: Negative for pain, discharge, redness, itching and visual disturbance.  Respiratory: Negative for cough, choking, chest tightness, shortness of breath, wheezing and stridor.   Cardiovascular: Negative for chest pain, palpitations and leg swelling.  Gastrointestinal: Negative for abdominal distention.  Genitourinary: Negative for dysuria, urgency, frequency, hematuria, flank pain, decreased urine volume, difficulty urinating and  dyspareunia.  Musculoskeletal: Negative for back pain, joint swelling, arthralgias and gait problem.  Neurological: Negative for dizziness, tremors, seizures, syncope, facial asymmetry, speech difficulty, weakness, light-headedness, numbness and headaches.  Hematological: Negative for adenopathy. Does not bruise/bleed easily.  Psychiatric/Behavioral: difficulty with emotional lability and confusion, memory problems   Objective:   Filed Vitals:   11/18/12 1233  BP: 161/101  Pulse: 84  Temp: 98 F (36.7 C)  Resp: 18    Physical Exam  Constitutional: Appears well-developed and well-nourished. No distress.  HENT: Normocephalic. External right and left ear normal. Oropharynx is clear and moist.  Eyes: Conjunctivae and EOM are normal. PERRLA, no scleral icterus.  Neck: Normal ROM. Neck supple. No JVD. No tracheal deviation. No thyromegaly.  CVS: RRR, S1/S2 +, no murmurs, no gallops, no carotid bruit.  Pulmonary: Effort and breath sounds normal, no stridor, rhonchi, wheezes, rales.  Abdominal: Soft. BS +,  no distension, tenderness, rebound or guarding.  Musculoskeletal: Normal range of motion. No edema and no tenderness.  Lymphadenopathy: No lymphadenopathy noted, cervical, inguinal. Neuro: Alert. Normal reflexes, muscle tone coordination. No cranial nerve deficit. Skin: Skin is warm and dry. No rash noted. Not diaphoretic. No erythema. No pallor.  Psychiatric: Flat affect.    Lab Results  Component Value Date   WBC 5.3 11/05/2012   HGB 11.8* 11/05/2012   HCT 35.9* 11/05/2012   MCV 90.0 11/05/2012   PLT 297 11/05/2012   Lab Results  Component Value Date   CREATININE 0.63 11/05/2012   BUN 9 11/05/2012  NA 138 11/05/2012   K 3.4* 11/05/2012   CL 101 11/05/2012   CO2 28 11/05/2012    No results found for this basename: HGBA1C   Lipid Panel  No results found for this basename: chol, trig, hdl, cholhdl, vldl, ldlcalc       Assessment and plan:   Patient Active Problem List    Diagnosis Date Noted  . PTSD (post-traumatic stress disorder) 11/15/2012  . Forgetfulness 11/07/2012  . Depression 11/07/2012   Further counseling and case management and social work arranged.  I gave the patient and her daughter local behavioral health resources and 24/7 hotline for [facility] and for [place].    I had already referred patient for social services and case management.  The patient has also been referred to neurology.    Follow up as already scheduled  The patient was given clear instructions to go to ER or return to medical center if symptoms don't improve, worsen or new problems develop.  The patient verbalized understanding.  The patient was told to call to get lab results if they haven't heard anything in the next week.    Rodney Langton, MD, CDE, FAAFP [facility] [place], [place]

## 2012-12-09 ENCOUNTER — Encounter: Payer: Self-pay | Admitting: Diagnostic Neuroimaging

## 2012-12-09 ENCOUNTER — Ambulatory Visit (INDEPENDENT_AMBULATORY_CARE_PROVIDER_SITE_OTHER): Payer: Medicare Other | Admitting: Diagnostic Neuroimaging

## 2012-12-09 VITALS — BP 134/82 | HR 66 | Ht 64.0 in | Wt 156.0 lb

## 2012-12-09 DIAGNOSIS — R413 Other amnesia: Secondary | ICD-10-CM

## 2012-12-09 DIAGNOSIS — F4320 Adjustment disorder, unspecified: Secondary | ICD-10-CM

## 2012-12-09 NOTE — Patient Instructions (Signed)
Follow up as needed
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
## 2012-12-20 ENCOUNTER — Ambulatory Visit: Payer: Medicare Other | Attending: Family Medicine | Admitting: Family Medicine

## 2012-12-20 VITALS — BP 146/86 | HR 65 | Temp 97.0°F | Resp 18 | Wt 158.4 lb

## 2012-12-20 DIAGNOSIS — F3289 Other specified depressive episodes: Secondary | ICD-10-CM

## 2012-12-20 DIAGNOSIS — R6889 Other general symptoms and signs: Secondary | ICD-10-CM

## 2012-12-20 DIAGNOSIS — F329 Major depressive disorder, single episode, unspecified: Secondary | ICD-10-CM

## 2012-12-20 DIAGNOSIS — F4310 Post-traumatic stress disorder, unspecified: Secondary | ICD-10-CM
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
## 2012-12-20 NOTE — Patient Instructions (Signed)
Post-Traumatic Stress Disorder If you have been diagnosed with post-traumatic stress disorder (PTSD), you have probably experienced a traumatic event in your life. These events are usually outside of the range of normal human experience and would negatively impact any normal person.  CAUSES  A person can get PTSD after living through or seeing a dangerous event such as:  An automobile accident.  War.  Natural disaster.  Rape.  Domestic violence.  Any event where there has been a threat to life. PTSD is a real illness. PTSD Can happen to anyone at any age. Children get PTSD too. A doctor, or mental health professional with experience in treating PTSD can help you. SYMPTOMS  Not all symptoms may be present in any one person.  Distressing dreams.  Flashback: feeling the frightening event is happening again.  Avoiding activities, places, and people that remind you of the event.  Avoiding thoughts and feelings associated with the event.  Having frightening thoughts you cannot control.  Feeling on the edge with increased alertness and vigilance.  Trouble sleeping.  Feeling alone, detached from others.  Angry outbursts.  Feeling worried, guilty, or sad.  Having thoughts of hurting yourself or others. PTSD may start soon after a frightening event or months or years later. Many war veterans have PTSD. Drinking alcohol or using drugs will not help PTSD and may even make it worse.  TREATMENT  PTSD can be treated. Treatment may include "talk" therapy, medicine, or both. Either a doctor or a mental health professional who is experienced in treating PTSD can help you. Early diagnosis and treatment is best and can show more rapid improvement. Get help if you or a loved one are thinking of hurting yourself. Call your local emergency medical services if you need help immediately. Document Released: 02/21/2001 Document Revised: 08/21/2011 Document Reviewed: 02/05/2008 [facility] Patient  Information 2014 [place], [place].

## 2012-12-20 NOTE — Progress Notes (Signed)
Follow up Behavior issues

## 2013-01-09 ENCOUNTER — Ambulatory Visit: Payer: Medicare Other | Attending: Family Medicine | Admitting: Internal Medicine

## 2013-01-09 ENCOUNTER — Encounter: Payer: Self-pay | Admitting: Family Medicine

## 2013-01-09 VITALS — BP 130/85 | HR 88 | Temp 98.5°F | Ht 64.0 in | Wt 156.8 lb

## 2013-01-09 DIAGNOSIS — W57XXXA Bitten or stung by nonvenomous insect and other nonvenomous arthropods, initial encounter: Secondary | ICD-10-CM | POA: Insufficient documentation

## 2013-01-09 DIAGNOSIS — S90569A Insect bite (nonvenomous), unspecified ankle, initial encounter: Secondary | ICD-10-CM | POA: Insufficient documentation

## 2013-01-09 DIAGNOSIS — S70361A Insect bite (nonvenomous), right thigh, initial encounter: Secondary | ICD-10-CM | POA: Insufficient documentation

## 2013-01-09 MED ORDER — HYDROCORTISONE 0.5 % EX OINT: TOPICAL_OINTMENT | Freq: Two times a day (BID) | CUTANEOUS | Status: DC

## 2013-01-09 MED ORDER — DIPHENHYDRAMINE HCL 25 MG PO TABS: 25.0000 mg | ORAL_TABLET | Freq: Three times a day (TID) | ORAL | Status: DC | PRN

## 2013-01-09 NOTE — Progress Notes (Signed)
Patient ID: Rhonda Peterson, female   DOB: July 05, 1950, 62 y.o.   MRN: [id number]  CC: Insect bite x 1 day  HPI:  [name] is a 62 years old pleasant woman with PMHx listed below who walked in to the clinic this afternoon complaining of redness and itching of her right thigh which started yesterday after an insect bite that she thinks may have been spider. She noticed a sting after a yard work in the evening yesterday and since then, there was itching and now redness. She thinks the itching has subsided but the redness persists which started little but has since expanded. She has no itching anywhere else, no cough or resp distress, no visual impairment and no chest pain or headache. No numbness. She is able to eat and drink without difficulty.  No Known Allergies Past Medical History  Diagnosis Date  . Concussion   . Depression   . Headache(784.0)   . Migraine    Current Outpatient Prescriptions on File Prior to Visit  Medication Sig Dispense Refill  . Multiple Vitamins-Minerals (HAIR VITAMINS PO) Take 1 tablet by mouth daily.       No current facility-administered medications on file prior to visit.   No family history on file. History   Social History  . Marital Status: Married    Spouse Name: N/A    Number of Children: N/A  . Years of Education: N/A   Occupational History  . Not on file.   Social History Main Topics  . Smoking status: Never Smoker   . Smokeless tobacco: Not on file  . Alcohol Use: No  . Drug Use: No  . Sexually Active: Not on file   Other Topics Concern  . Not on file   Social History Narrative  . No narrative on file    Review of Systems: Constitutional: Negative for fever, chills, diaphoresis, activity change, appetite change and fatigue. HENT: Negative for ear pain, nosebleeds, congestion, facial swelling, rhinorrhea, neck pain, neck stiffness and ear discharge.  Eyes: Negative for pain, discharge, redness, itching and visual  disturbance. Respiratory: Negative for cough, choking, chest tightness, shortness of breath, wheezing and stridor.  Cardiovascular: Negative for chest pain, palpitations and leg swelling. Gastrointestinal: Negative for abdominal distention. Genitourinary: Negative for dysuria, urgency, frequency, hematuria, flank pain, decreased urine volume, difficulty urinating and dyspareunia.  Musculoskeletal: Negative for back pain, joint swelling, arthralgias and gait problem. Neurological: Negative for dizziness, tremors, seizures, syncope, facial asymmetry, speech difficulty, weakness, light-headedness, numbness and headaches.  Hematological: Negative for adenopathy. Does not bruise/bleed easily. Psychiatric/Behavioral: Negative for hallucinations, behavioral problems, confusion, dysphoric mood, decreased concentration and agitation.    Objective:   Filed Vitals:   01/09/13 1505  BP: 130/85  Pulse: 88  Temp: 98.5 F (36.9 C)    Physical Exam: Constitutional: Patient appears well-developed and well-nourished. No distress. HENT: Normocephalic, atraumatic, External right and left ear normal. Oropharynx is clear and moist.  Eyes: Conjunctivae and EOM are normal. PERRLA, no scleral icterus. Neck: Normal ROM. Neck supple. No JVD. No tracheal deviation. No thyromegaly. CVS: RRR, S1/S2 +, no murmurs, no gallops, no carotid bruit.  Pulmonary: Effort and breath sounds normal, no stridor, rhonchi, wheezes, rales.  Abdominal: Soft. BS +,  no distension, tenderness, rebound or guarding.  Musculoskeletal: Rt Thigh laterally showed area of erythema and induration of about 10 x 15 cm, no tenderness, no warmth, a central punctum noticed, no discharge.  Lymphadenopathy: No lymphadenopathy noted, cervical, inguinal or axillary Neuro: Alert. Normal  reflexes, muscle tone coordination. No cranial nerve deficit. Skin: Skin is warm and dry. No rash noted. Not diaphoretic. No erythema. No pallor. Psychiatric: Normal  mood and affect. Behavior, judgment, thought content normal.  Lab Results  Component Value Date   WBC 5.3 11/05/2012   HGB 11.8* 11/05/2012   HCT 35.9* 11/05/2012   MCV 90.0 11/05/2012   PLT 297 11/05/2012   Lab Results  Component Value Date   CREATININE 0.63 11/05/2012   BUN 9 11/05/2012   NA 138 11/05/2012   K 3.4* 11/05/2012   CL 101 11/05/2012   CO2 28 11/05/2012    No results found for this basename: HGBA1C   Lipid Panel  No results found for this basename: chol, trig, hdl, cholhdl, vldl, ldlcalc       Assessment and plan:    Insect Bite of Right Thigh with Local Reaction  Plan:  Hydrocortisone cream local application  Benadryl tabs prn itching  Go to Urgent Care or ER if area of induration and erythema continues to enlarge or breathing difficulty or intractable pain.  Patient will be scheduled for annual physical, pap smear, colonoscopy and mammography next visit. Exercise and Nutritional counseling done  Jeanann Lewandowsky, MD [facility] [place], [place] [phone]   01/09/2013, 4:27 PM

## 2013-01-17 ENCOUNTER — Ambulatory Visit: Payer: Medicare Other

## 2013-02-11 ENCOUNTER — Ambulatory Visit: Payer: Medicare Other

## 2013-02-13 ENCOUNTER — Ambulatory Visit: Payer: Medicare Other | Attending: Internal Medicine | Admitting: Internal Medicine

## 2013-02-13 VITALS — BP 143/82 | HR 79 | Temp 98.3°F | Resp 16 | Wt 156.0 lb

## 2013-02-13 DIAGNOSIS — F329 Major depressive disorder, single episode, unspecified: Secondary | ICD-10-CM

## 2013-02-13 DIAGNOSIS — F3289 Other specified depressive episodes: Secondary | ICD-10-CM

## 2013-02-13 DIAGNOSIS — R6889 Other general symptoms and signs: Secondary | ICD-10-CM

## 2013-02-13 DIAGNOSIS — F4310 Post-traumatic stress disorder, unspecified: Secondary | ICD-10-CM

## 2013-02-13 DIAGNOSIS — J329 Chronic sinusitis, unspecified: Secondary | ICD-10-CM

## 2013-02-13 MED ORDER — LORATADINE 10 MG PO TABS: 10.0000 mg | ORAL_TABLET | Freq: Every day | ORAL | Status: DC | PRN

## 2013-02-13 NOTE — Progress Notes (Signed)
Patient here for physical and follow up
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
## 2013-05-14 ENCOUNTER — Encounter (HOSPITAL_COMMUNITY): Payer: Self-pay | Admitting: Emergency Medicine

## 2013-05-14 ENCOUNTER — Emergency Department (HOSPITAL_COMMUNITY)
Admission: EM | Admit: 2013-05-14 | Discharge: 2013-05-15 | Disposition: A | Discharge status: Home / Self Care | Payer: Medicare Other | Attending: Emergency Medicine | Admitting: Emergency Medicine

## 2013-05-14 DIAGNOSIS — Y9389 Activity, other specified: Secondary | ICD-10-CM | POA: Insufficient documentation

## 2013-05-14 DIAGNOSIS — Z8659 Personal history of other mental and behavioral disorders: Secondary | ICD-10-CM | POA: Insufficient documentation

## 2013-05-14 DIAGNOSIS — Z87828 Personal history of other (healed) physical injury and trauma: Secondary | ICD-10-CM | POA: Insufficient documentation

## 2013-05-14 DIAGNOSIS — IMO0001 Reserved for inherently not codable concepts without codable children: Secondary | ICD-10-CM | POA: Insufficient documentation

## 2013-05-14 DIAGNOSIS — W57XXXA Bitten or stung by nonvenomous insect and other nonvenomous arthropods, initial encounter: Secondary | ICD-10-CM

## 2013-05-14 DIAGNOSIS — Y929 Unspecified place or not applicable: Secondary | ICD-10-CM | POA: Insufficient documentation

## 2013-05-14 DIAGNOSIS — Z8679 Personal history of other diseases of the circulatory system: Secondary | ICD-10-CM | POA: Insufficient documentation

## 2013-05-14 NOTE — ED Notes (Signed)
Pt in c/o insect bite to left forearm, states she was at a thrift store and felt a stinging 1-2 hours ago, redness and swelling noted, area marked with skin marker in triage

## 2013-05-15 ENCOUNTER — Telehealth: Payer: Self-pay | Admitting: Internal Medicine

## 2013-05-15 MED ORDER — DIPHENHYDRAMINE HCL 25 MG PO TABS: 25.0000 mg | ORAL_TABLET | Freq: Four times a day (QID) | ORAL | Status: DC

## 2013-05-15 MED ORDER — CEPHALEXIN 500 MG PO CAPS: 500.0000 mg | ORAL_CAPSULE | Freq: Four times a day (QID) | ORAL | Status: DC

## 2013-05-15 MED ORDER — DIPHENHYDRAMINE HCL 25 MG PO CAPS
50.0000 mg | ORAL_CAPSULE | Freq: Once | ORAL | Status: AC
  Administered 2013-05-15: 50 mg via ORAL
  Filled 2013-05-15: qty 2

## 2013-05-15 NOTE — Telephone Encounter (Signed)
Informed pt she will be called when Tdap vaccines come in

## 2013-05-15 NOTE — ED Provider Notes (Signed)
Medical screening examination/treatment/procedure(s) were performed by non-physician practitioner and as supervising physician I was immediately available for consultation/collaboration.   Merle Whitehorn, MD 05/15/13 0645 

## 2013-05-15 NOTE — ED Provider Notes (Signed)
CSN: [id number]     Arrival date & time 05/14/13  2310 History   First MD Initiated Contact with Patient 05/14/13 2342     Chief Complaint  Patient presents with  . Insect Bite   (Consider location/radiation/quality/duration/timing/severity/associated sxs/prior Treatment) HPI Comments: Patient presents with acute onset of forearm redness and itching which started about 1-2 hours prior to arrival. No fever, nausea, or vomiting. No treatments prior to arrival. Patient states that something bit her on the arm. She felt a stinging sensation but did not see what bit her. Aggravating factors: none. Alleviating factors: none.    The history is provided by the patient.    Past Medical History  Diagnosis Date  . Concussion   . Depression   . Headache(784.0)   . Migraine    Past Surgical History  Procedure Laterality Date  . Back surgery    . Knee surgery     History reviewed. No pertinent family history. History  Substance Use Topics  . Smoking status: Never Smoker   . Smokeless tobacco: Not on file  . Alcohol Use: No   OB History   Grav Para Term Preterm Abortions TAB SAB Ect Mult Living                 Review of Systems  Constitutional: Negative for fever.  HENT: Negative for facial swelling and trouble swallowing.   Eyes: Negative for redness.  Respiratory: Negative for shortness of breath, wheezing and stridor.   Cardiovascular: Negative for chest pain.  Gastrointestinal: Negative for nausea and vomiting.  Musculoskeletal: Negative for myalgias.  Skin: Positive for rash.  Neurological: Negative for light-headedness.  Psychiatric/Behavioral: Negative for confusion.    Allergies  Review of patient's allergies indicates no known allergies.  Home Medications   Current Outpatient Rx  Name  Route  Sig  Dispense  Refill  . Multiple Vitamins-Minerals (HAIR VITAMINS PO)   Oral   Take 1 tablet by mouth daily.          BP 127/75  Pulse 78  Temp(Src) 98.7 F (37.1  C) (Oral)  Resp 18  SpO2 100% Physical Exam  Nursing note and vitals reviewed. Constitutional: She appears well-developed and well-nourished.  HENT:  Head: Normocephalic and atraumatic.  Eyes: Conjunctivae are normal.  Neck: Normal range of motion. Neck supple.  Pulmonary/Chest: No respiratory distress.  Neurological: She is alert.  Skin: Skin is warm and dry.  8cm x 3cm oval area of redness with two small papules in center of redness. No lymphangitis. Minimal warmth.   Psychiatric: She has a normal mood and affect.    ED Course  Procedures (including critical care time) Labs Review Labs Reviewed - No data to display Imaging Review No results found.  EKG Interpretation   None      12:40 AM Patient seen and examined.    Vital signs reviewed and are as follows: Filed Vitals:   05/14/13 2320  BP: 127/75  Pulse: 78  Temp: 98.7 F (37.1 C)  Resp: 18   Will treat for allergic rxn. She will be d/c to home with keflex which she was told to fill only if worsening, streaking up arm, fever, no improvement in 24 hours.    MDM   1. Insect bite    Given history, suspect allergic reaction. Appearance is similar to bed bug bite. No systemic sx. Abx given if worsening and not responding to benadryl.     Renne Crigler, PA-C 05/15/13 [phone]

## 2013-05-15 NOTE — Telephone Encounter (Signed)
She received a letter from out clinic saying that she was due to have Tdap and other immunizations and she would like clarification as to what other vaccines she needs before scheduling appt. Please f/u with pt.

## 2013-06-19 ENCOUNTER — Ambulatory Visit: Payer: Medicare Other | Attending: Internal Medicine | Admitting: Family Medicine

## 2013-06-19 VITALS — BP 158/84 | HR 66 | Temp 98.2°F | Resp 16 | Wt 155.0 lb

## 2013-06-19 DIAGNOSIS — R0981 Nasal congestion: Secondary | ICD-10-CM

## 2013-06-19 DIAGNOSIS — J3489 Other specified disorders of nose and nasal sinuses: Secondary | ICD-10-CM | POA: Diagnosis not present

## 2013-06-19 NOTE — Progress Notes (Signed)
   Subjective:    Patient ID: Rhonda Peterson, female    DOB: Aug 13, 1950, 63 y.o.   MRN: [id number]  HPI This patient is seen today for nasal congestion) he knows. Symptoms of been going on for one week and have gotten neither better nor worse. She denies fever, cough, sinus pain or pressure, GI symptoms, myalgias or achiness, or any other symptoms. She does endorse stress in her life and talks a great deal about this.   Review of Systems A 12 point review of systems is negative except as per hpi.       Objective:   Physical Exam  Nursing note and vitals reviewed. Constitutional: She is oriented to person, place, and time. She appears well-developed and well-nourished.  HENT:  Right Ear: External ear normal.  Left Ear: External ear normal.  Nose: Nose normal.  Mouth/Throat: Oropharynx is clear and moist. No oropharyngeal exudate.  Eyes: Conjunctivae are normal. Pupils are equal, round, and reactive to light.  Neck: Normal range of motion. Neck supple. No thyromegaly present.  Cardiovascular: Normal rate, regular rhythm and normal heart sounds.   Pulmonary/Chest: Effort normal and breath sounds normal.  Abdominal: Soft. Bowel sounds are normal. She exhibits no distension. There is no tenderness. There is no rebound.  Lymphadenopathy:    She has no cervical adenopathy.  Neurological: She is alert and oriented to person, place, and time. She has normal reflexes.  Skin: Skin is warm and dry.  Psychiatric: She has a normal mood and  Flat affect. Her behavior is slightly abnormal in that she came in with a chief complaint of upper respiratory symptoms and talks continuously about the stress in her life. She does deny suicidal or homicidal ideations. And declines counseling..        Assessment & Plan:  Nasal congestion  Reassured her that this is probably viral in origin. Suggested she try nasal saline. She can also try Afrin for 3 days it should like. We discussed red flag symptoms of  sinus infection and she will return to clinic if she develops any new or concerning symptoms. She'll call if any questions

## 2013-06-19 NOTE — Patient Instructions (Signed)

## 2013-06-19 NOTE — Progress Notes (Signed)
Patient states has not been seen in a while Here for a check up Has been congested

## 2013-09-11 ENCOUNTER — Emergency Department (HOSPITAL_COMMUNITY): Payer: Medicare Other

## 2013-09-11 ENCOUNTER — Emergency Department (HOSPITAL_COMMUNITY)
Admission: EM | Admit: 2013-09-11 | Discharge: 2013-09-11 | Disposition: A | Discharge status: Home / Self Care | Payer: Medicare Other | Attending: Emergency Medicine | Admitting: Emergency Medicine

## 2013-09-11 ENCOUNTER — Encounter (HOSPITAL_COMMUNITY): Payer: Self-pay | Admitting: Emergency Medicine

## 2013-09-11 DIAGNOSIS — Z8659 Personal history of other mental and behavioral disorders: Secondary | ICD-10-CM | POA: Insufficient documentation

## 2013-09-11 DIAGNOSIS — S99919A Unspecified injury of unspecified ankle, initial encounter: Secondary | ICD-10-CM | POA: Diagnosis not present

## 2013-09-11 DIAGNOSIS — M25569 Pain in unspecified knee: Secondary | ICD-10-CM | POA: Diagnosis not present

## 2013-09-11 DIAGNOSIS — W57XXXA Bitten or stung by nonvenomous insect and other nonvenomous arthropods, initial encounter: Secondary | ICD-10-CM | POA: Diagnosis not present

## 2013-09-11 DIAGNOSIS — S40269A Insect bite (nonvenomous) of unspecified shoulder, initial encounter: Secondary | ICD-10-CM | POA: Diagnosis not present

## 2013-09-11 DIAGNOSIS — Z9889 Other specified postprocedural states: Secondary | ICD-10-CM | POA: Insufficient documentation

## 2013-09-11 DIAGNOSIS — S8990XA Unspecified injury of unspecified lower leg, initial encounter: Secondary | ICD-10-CM | POA: Diagnosis not present

## 2013-09-11 DIAGNOSIS — M25562 Pain in left knee: Secondary | ICD-10-CM

## 2013-09-11 DIAGNOSIS — S99929A Unspecified injury of unspecified foot, initial encounter: Principal | ICD-10-CM

## 2013-09-11 DIAGNOSIS — Z8679 Personal history of other diseases of the circulatory system: Secondary | ICD-10-CM | POA: Insufficient documentation

## 2013-09-11 DIAGNOSIS — Y9389 Activity, other specified: Secondary | ICD-10-CM | POA: Insufficient documentation

## 2013-09-11 DIAGNOSIS — R296 Repeated falls: Secondary | ICD-10-CM | POA: Insufficient documentation

## 2013-09-11 DIAGNOSIS — Y9289 Other specified places as the place of occurrence of the external cause: Secondary | ICD-10-CM | POA: Insufficient documentation

## 2013-09-11 DIAGNOSIS — S90569A Insect bite (nonvenomous), unspecified ankle, initial encounter: Secondary | ICD-10-CM | POA: Diagnosis not present

## 2013-09-11 MED ORDER — PERMETHRIN 1 % EX LOTN: 1.0000 "application " | TOPICAL_LOTION | Freq: Once | CUTANEOUS | Status: DC

## 2013-09-11 MED ORDER — IBUPROFEN 400 MG PO TABS: 400.0000 mg | ORAL_TABLET | Freq: Four times a day (QID) | ORAL | Status: DC | PRN

## 2013-09-11 NOTE — ED Provider Notes (Signed)
CSN: [id number]     Arrival date & time 09/11/13  2143 History  This chart was scribed for non-physician practitioner, Delos Haring, PA-C working with Orpah Greek, MD by Frederich Balding, ED scribe. This patient was seen in room TR10C/TR10C and the patient's care was started at 11:15 PM.   Chief Complaint  Patient presents with  . Knee Injury  . Rash   The history is provided by the patient. No language interpreter was used.   HPI Comments: Rhonda Peterson is a 63 y.o. female who presents to the Emergency Department complaining of left knee injury that occurred earlier today. Pt states she was doing yard work and fell onto her left knee. She has sudden onset left knee pain. Straightening her knee worsens the pain. Pt has had two left knee surgeries in the distant past. She is also complaining of an itchy rash to her bilateral arms. Pt states she recently got a dog but she hasn't taken it to the vet yet. Not sure if the dog has fleas.  Past Medical History  Diagnosis Date  . Concussion   . Depression   . Headache(784.0)   . Migraine    Past Surgical History  Procedure Laterality Date  . Back surgery    . Knee surgery     No family history on file. History  Substance Use Topics  . Smoking status: Never Smoker   . Smokeless tobacco: Not on file  . Alcohol Use: No   OB History   Grav Para Term Preterm Abortions TAB SAB Ect Mult Living                 Review of Systems  Musculoskeletal: Positive for arthralgias.  Skin: Positive for rash.  All other systems reviewed and are negative.   Allergies  Review of patient's allergies indicates no known allergies.  Home Medications   Current Outpatient Rx  Name  Route  Sig  Dispense  Refill  . diphenhydrAMINE (BENADRYL) 25 MG tablet   Oral   Take 1 tablet (25 mg total) by mouth every 6 (six) hours.   20 tablet   0   . Multiple Vitamins-Minerals (HAIR VITAMINS PO)   Oral   Take 1 tablet by mouth daily.         Marland Kitchen  ibuprofen (ADVIL,MOTRIN) 400 MG tablet   Oral   Take 1 tablet (400 mg total) by mouth every 6 (six) hours as needed.   30 tablet   0   . permethrin (PERMETHRIN LICE TREATMENT) 1 % lotion   Topical   Apply 1 application topically once. Shampoo, rinse and towel dry hair, saturate hair and scalp with permethrin. Rinse after 10 min; repeat in 1 week if needed   59 mL   0    BP 143/85  Pulse 93  Temp(Src) 98.3 F (36.8 C)  Resp 16  SpO2 100%  Physical Exam  Nursing note and vitals reviewed. Constitutional: She is oriented to person, place, and time. She appears well-developed and well-nourished. No distress.  HENT:  Head: Normocephalic and atraumatic.  Eyes: EOM are normal.  Neck: Neck supple. No tracheal deviation present.  Cardiovascular: Normal rate.   Pulmonary/Chest: Effort normal. No respiratory distress.  Musculoskeletal: Normal range of motion.       Left knee: She exhibits normal range of motion, no swelling, no effusion, no ecchymosis, no deformity, no laceration, normal alignment, no LCL laxity and no bony tenderness. No tenderness found. No lateral joint  line and no patellar tendon tenderness noted.       Legs: Neurological: She is alert and oriented to person, place, and time.  Skin: Skin is warm and dry. Rash (bilateral arms) noted.  Psychiatric: She has a normal mood and affect. Her behavior is normal.    ED Course  Procedures (including critical care time)  DIAGNOSTIC STUDIES: Oxygen Saturation is 100% on RA, normal by my interpretation.    COORDINATION OF CARE: 11:21 PM-Discussed treatment plan which includes an anti-inflammatory, ice, elevation and a topical cream with pt at bedside and pt agreed to plan.   Labs Review Labs Reviewed - No data to display Imaging Review Dg Knee Complete 4 Views Left  09/11/2013   CLINICAL DATA:  Status post fall; left knee injury, with anterior left knee pain.  EXAM: LEFT KNEE - COMPLETE 4+ VIEW  COMPARISON:  None.   FINDINGS: There is no evidence of fracture or dislocation. The joint spaces are preserved. Prominent marginal osteophytes are seen arising at the medial and patellofemoral compartments.  No significant joint effusion is seen. The visualized soft tissues are normal in appearance.  IMPRESSION: 1. No evidence of fracture or dislocation. 2. Mild degenerative change at the medial and patellofemoral compartments.   Electronically Signed   By: Garald Balding M.D.   On: 09/11/2013 22:57     EKG Interpretation None      MDM   Final diagnoses:  Insect bites  Left knee pain    Patients knee pain is very mild. Does not require anymore imaging. No difficulty ambulating. No fevers. Rash is mild and looks like flea bites.  63 y.o.Rhonda Peterson's evaluation in the Emergency Department is complete. It has been determined that no acute conditions requiring further emergency intervention are present at this time. The patient/guardian have been advised of the diagnosis and plan. We have discussed signs and symptoms that warrant return to the ED, such as changes or worsening in symptoms.  Vital signs are stable at discharge. Filed Vitals:   09/11/13 2158  BP: 143/85  Pulse: 93  Temp: 98.3 F (36.8 C)  Resp: 16    Patient/guardian has voiced understanding and agreed to follow-up with the PCP or specialist.   I personally performed the services described in this documentation, which was scribed in my presence. The recorded information has been reviewed and is accurate.  Linus Mako, PA-C 09/11/13 2339

## 2013-09-11 NOTE — ED Notes (Signed)
Pt. Was doing yard work and fell onto left knee. Reports pain with ambulation. Ambulated to room with no difficulties.

## 2013-09-11 NOTE — ED Provider Notes (Signed)
Medical screening examination/treatment/procedure(s) were performed by non-physician practitioner and as supervising physician I was immediately available for consultation/collaboration.   EKG Interpretation None        Orpah Greek, MD 09/11/13 [phone]

## 2013-09-11 NOTE — Discharge Instructions (Signed)
Knee Pain Knee pain can be a result of an injury or other medical conditions. Treatment will depend on the cause of your pain. HOME CARE  Only take medicine as told by your doctor.  Keep a healthy weight. Being overweight can make the knee hurt more.  Stretch before exercising or playing sports.  If there is constant knee pain, change the way you exercise. Ask your doctor for advice.  Make sure shoes fit well. Choose the right shoe for the sport or activity.  Protect your knees. Wear kneepads if needed.  Rest when you are tired. GET HELP RIGHT AWAY IF:   Your knee pain does not stop.  Your knee pain does not get better.  Your knee joint feels hot to the touch.  You have a fever. MAKE SURE YOU:   Understand these instructions.  Will watch this condition.  Will get help right away if you are not doing well or get worse. Document Released: 08/25/2008 Document Revised: 08/21/2011 Document Reviewed: 08/25/2008 [facility] Patient Information 2014 [place], [place].  Musculoskeletal Pain Musculoskeletal pain is muscle and boney aches and pains. These pains can occur in any part of the body. Your caregiver may treat you without knowing the cause of the pain. They may treat you if blood or urine tests, X-rays, and other tests were normal.  CAUSES There is often not a definite cause or reason for these pains. These pains may be caused by a type of germ (virus). The discomfort may also come from overuse. Overuse includes working out too hard when your body is not fit. Boney aches also come from weather changes. Bone is sensitive to atmospheric pressure changes. HOME CARE INSTRUCTIONS   Ask when your test results will be ready. Make sure you get your test results.  Only take over-the-counter or prescription medicines for pain, discomfort, or fever as directed by your caregiver. If you were given medications for your condition, do not drive, operate machinery or power tools, or sign legal  documents for 24 hours. Do not drink alcohol. Do not take sleeping pills or other medications that may interfere with treatment.  Continue all activities unless the activities cause more pain. When the pain lessens, slowly resume normal activities. Gradually increase the intensity and duration of the activities or exercise.  During periods of severe pain, bed rest may be helpful. Lay or sit in any position that is comfortable.  Putting ice on the injured area.  Put ice in a bag.  Place a towel between your skin and the bag.  Leave the ice on for 15 to 20 minutes, 3 to 4 times a day.  Follow up with your caregiver for continued problems and no reason can be found for the pain. If the pain becomes worse or does not go away, it may be necessary to repeat tests or do additional testing. Your caregiver may need to look further for a possible cause. SEEK IMMEDIATE MEDICAL CARE IF:  You have pain that is getting worse and is not relieved by medications.  You develop chest pain that is associated with shortness or breath, sweating, feeling sick to your stomach (nauseous), or throw up (vomit).  Your pain becomes localized to the abdomen.  You develop any new symptoms that seem different or that concern you. MAKE SURE YOU:   Understand these instructions.  Will watch your condition.  Will get help right away if you are not doing well or get worse. Document Released: 05/29/2005 Document Revised: 08/21/2011 Document  Reviewed: 01/31/2013 [facility] Patient Information 2014 [place], [place].

## 2013-09-11 NOTE — ED Notes (Signed)
Pt. Transported to xray 

## 2013-11-08 ENCOUNTER — Encounter (HOSPITAL_COMMUNITY): Payer: Self-pay | Admitting: Emergency Medicine

## 2013-11-08 ENCOUNTER — Emergency Department (HOSPITAL_COMMUNITY)
Admission: EM | Admit: 2013-11-08 | Discharge: 2013-11-08 | Disposition: A | Discharge status: Home / Self Care | Payer: Medicare Other | Attending: Emergency Medicine | Admitting: Emergency Medicine

## 2013-11-08 DIAGNOSIS — Z87828 Personal history of other (healed) physical injury and trauma: Secondary | ICD-10-CM | POA: Insufficient documentation

## 2013-11-08 DIAGNOSIS — Z8669 Personal history of other diseases of the nervous system and sense organs: Secondary | ICD-10-CM | POA: Insufficient documentation

## 2013-11-08 DIAGNOSIS — B86 Scabies: Secondary | ICD-10-CM | POA: Insufficient documentation

## 2013-11-08 DIAGNOSIS — F329 Major depressive disorder, single episode, unspecified: Secondary | ICD-10-CM | POA: Insufficient documentation

## 2013-11-08 DIAGNOSIS — F3289 Other specified depressive episodes: Secondary | ICD-10-CM | POA: Insufficient documentation

## 2013-11-08 DIAGNOSIS — R21 Rash and other nonspecific skin eruption: Secondary | ICD-10-CM

## 2013-11-08 MED ORDER — PERMETHRIN 5 % EX CREA: TOPICAL_CREAM | CUTANEOUS | Status: DC

## 2013-11-08 NOTE — ED Provider Notes (Signed)
CSN: [id number]     Arrival date & time 11/08/13  1546 History   First MD Initiated Contact with Patient 11/08/13 1641     Chief Complaint  Patient presents with  . Rash     (Consider location/radiation/quality/duration/timing/severity/associated sxs/prior Treatment) Patient is a 63 y.o. female presenting with rash. The history is provided by the patient.  Rash Associated symptoms: no abdominal pain, no diarrhea, no fever, no headaches, no joint pain, no myalgias, no shortness of breath, no sore throat and not vomiting   pt c/o pruritic rash dorsum bil hands, wrist, forearm/arms for the past few months. States initially started a couple months after getting a new pet/dog.  States the dog has been gone for 4-6 weeks, but rash remains. At onset rash, denies any medication/new medication use. No change in any home or personal products. No chemical exposure, no change in diet/foods.  Pt states symptoms have persisted. No acute or abrupt change or worsening this week, pt states just frustrated hasnt resolved after getting rid of animal. States otherwise does not feel sick or ill. No recent febrile illness. Normal appetite, normal energy level and overall function.  No hx same rash.     Past Medical History  Diagnosis Date  . Concussion   . Depression   . Headache(784.0)   . Migraine    Past Surgical History  Procedure Laterality Date  . Back surgery    . Knee surgery     No family history on file. History  Substance Use Topics  . Smoking status: Never Smoker   . Smokeless tobacco: Not on file  . Alcohol Use: No   OB History   Grav Para Term Preterm Abortions TAB SAB Ect Mult Living                 Review of Systems  Constitutional: Negative for fever and chills.  HENT: Negative for mouth sores and sore throat.   Eyes: Negative for redness.  Respiratory: Negative for cough and shortness of breath.   Cardiovascular: Negative for chest pain and leg swelling.  Gastrointestinal:  Negative for vomiting, abdominal pain and diarrhea.  Genitourinary: Negative for flank pain.  Musculoskeletal: Negative for arthralgias and myalgias.  Skin: Positive for rash.  Neurological: Negative for headaches.  Hematological: Does not bruise/bleed easily.  Psychiatric/Behavioral: Negative for confusion.      Allergies  Review of patient's allergies indicates no known allergies.  Home Medications   Prior to Admission medications   Medication Sig Start Date End Date Taking? Authorizing Provider  diphenhydrAMINE (BENADRYL) 25 MG tablet Take 25 mg by mouth every 6 (six) hours as needed for itching.   Yes Historical Provider, MD  IBUPROFEN PO Take 2 tablets by mouth every 6 (six) hours as needed (pain).   Yes Historical Provider, MD  Multiple Vitamins-Minerals (HAIR VITAMINS PO) Take 1 tablet by mouth daily.   Yes Historical Provider, MD   BP 122/89  Pulse 91  Temp(Src) 98.3 F (36.8 C)  Resp 18  Ht 5\' 4"  (1.626 m)  Wt 125 lb (56.7 kg)  BMI 21.45 kg/m2  SpO2 99% Physical Exam  Nursing note and vitals reviewed. Constitutional: She is oriented to person, place, and time. She appears well-developed and well-nourished. No distress.  HENT:  Mouth/Throat: Oropharynx is clear and moist.  No mucous membrane lesions  Eyes: Conjunctivae are normal. No scleral icterus.  Neck: Neck supple. No tracheal deviation present.  Cardiovascular: Normal rate, regular rhythm, normal heart sounds and intact  distal pulses.   Pulmonary/Chest: Effort normal and breath sounds normal. No respiratory distress.  Abdominal: Soft. Normal appearance. She exhibits no distension. There is no tenderness.  Musculoskeletal: She exhibits no edema and no tenderness.  Neurological: She is alert and oriented to person, place, and time.  Skin: Skin is warm and dry. Rash noted. She is not diaphoretic.  Several small 1-3 mm lesions to dorsum bil hands, wrist, forearms/arms, w several areas appearing c/w scabies. No  palms/soles. No other areas rash/skin lesions.   Psychiatric: She has a normal mood and affect.    ED Course  Procedures (including critical care time)   MDM  Several of areas/lesions about dorsum hand/wrist appear c/w scabies, will give rx.  Discussed plan of derm f/u if symptoms fail to resolve w above rx.   Pt appears stable for d/c.     Mirna Mires, MD 11/08/13 [phone]

## 2013-11-08 NOTE — ED Notes (Signed)
Pt c/o generalized body rash ongoing for over 2 months. Pt reports that her family had a dog and that's when she started getting the rash to arms and legs. Pt has what appears like bug bite marks on her arm. Pt denies any other family members with rash.

## 2013-11-08 NOTE — Discharge Instructions (Signed)
Use permethrin cream as directed per instructions - also see informational sheet. If symptoms fail to improve/resolve, follow up with dermatologist in 2 weeks.  Return to ER if worse, new symptoms, fevers, other concern.     Contact Dermatitis Contact dermatitis is a rash that happens when something touches the skin. You touched something that irritates your skin, or you have allergies to something you touched. HOME CARE   Avoid the thing that caused your rash.  Keep your rash away from hot water, soap, sunlight, chemicals, and other things that might bother it.  Do not scratch your rash.  You can take cool baths to help stop itching.  Only take medicine as told by your doctor.  Keep all doctor visits as told. GET HELP RIGHT AWAY IF:   Your rash is not better after 3 days.  Your rash gets worse.  Your rash is puffy (swollen), tender, red, sore, or warm.  You have problems with your medicine. MAKE SURE YOU:   Understand these instructions.  Will watch your condition.  Will get help right away if you are not doing well or get worse. Document Released: 03/26/2009 Document Revised: 08/21/2011 Document Reviewed: 11/01/2010 [facility] Patient Information 2014 [place], [place].     Scabies Scabies are small bugs (mites) that burrow under the skin and cause red bumps and severe itching. These bugs can only be seen with a microscope. Scabies are highly contagious. They can spread easily from person to person by direct contact. They are also spread through sharing clothing or linens that have the scabies mites living in them. It is not unusual for an entire family to become infected through shared towels, clothing, or bedding.  HOME CARE INSTRUCTIONS   Your caregiver may prescribe a cream or lotion to kill the mites. If cream is prescribed, massage the cream into the entire body from the neck to the bottom of both feet. Also massage the cream into the scalp and face if your child is  less than 66 year old. Avoid the eyes and mouth. Do not wash your hands after application.  Leave the cream on for 8 to 12 hours. Your child should bathe or shower after the 8 to 12 hour application period. Sometimes it is helpful to apply the cream to your child right before bedtime.  One treatment is usually effective and will eliminate approximately 95% of infestations. For severe cases, your caregiver may decide to repeat the treatment in 1 week. Everyone in your household should be treated with one application of the cream.  New rashes or burrows should not appear within 24 to 48 hours after successful treatment. However, the itching and rash may last for 2 to 4 weeks after successful treatment. Your caregiver may prescribe a medicine to help with the itching or to help the rash go away more quickly.  Scabies can live on clothing or linens for up to 3 days. All of your child's recently used clothing, towels, stuffed toys, and bed linens should be washed in hot water and then dried in a dryer for at least 20 minutes on high heat. Items that cannot be washed should be enclosed in a plastic bag for at least 3 days.  To help relieve itching, bathe your child in a cool bath or apply cool washcloths to the affected areas.  Your child may return to school after treatment with the prescribed cream. SEEK MEDICAL CARE IF:   The itching persists longer than 4 weeks after treatment.  The rash spreads or becomes infected. Signs of infection include red blisters or yellow-tan crust. Document Released: 05/29/2005 Document Revised: 08/21/2011 Document Reviewed: 10/07/2008 [facility] Patient Information 2014 [place].

## 2014-05-06 DIAGNOSIS — Z23 Encounter for immunization: Secondary | ICD-10-CM | POA: Diagnosis not present

## 2014-08-20 ENCOUNTER — Encounter: Payer: Medicare Other | Admitting: Internal Medicine

## 2014-09-08 ENCOUNTER — Other Ambulatory Visit (HOSPITAL_COMMUNITY)
Admission: RE | Admit: 2014-09-08 | Discharge: 2014-09-08 | Disposition: A | Discharge status: Home / Self Care | Payer: Medicare Other | Source: Ambulatory Visit | Attending: Internal Medicine | Admitting: Internal Medicine

## 2014-09-08 ENCOUNTER — Encounter: Payer: Self-pay | Admitting: Internal Medicine

## 2014-09-08 ENCOUNTER — Ambulatory Visit: Payer: Medicare Other | Attending: Internal Medicine | Admitting: Internal Medicine

## 2014-09-08 VITALS — BP 132/88 | HR 77 | Temp 98.0°F | Resp 16 | Ht 64.0 in | Wt 148.0 lb

## 2014-09-08 DIAGNOSIS — Z124 Encounter for screening for malignant neoplasm of cervix: Secondary | ICD-10-CM | POA: Insufficient documentation

## 2014-09-08 DIAGNOSIS — Z1151 Encounter for screening for human papillomavirus (HPV): Secondary | ICD-10-CM | POA: Insufficient documentation

## 2014-09-08 DIAGNOSIS — N760 Acute vaginitis: Secondary | ICD-10-CM | POA: Diagnosis not present

## 2014-09-08 DIAGNOSIS — Z0000 Encounter for general adult medical examination without abnormal findings: Secondary | ICD-10-CM | POA: Diagnosis not present

## 2014-09-08 LAB — CBC WITH DIFFERENTIAL/PLATELET
Basophils Absolute: 0 10*3/uL (ref 0.0–0.1)
Basophils Relative: 0 % (ref 0–1)
EOS ABS: 0.1 10*3/uL (ref 0.0–0.7)
Eosinophils Relative: 2 % (ref 0–5)
HEMATOCRIT: 36.2 % (ref 36.0–46.0)
Hemoglobin: 12.1 g/dL (ref 12.0–15.0)
LYMPHS ABS: 1.2 10*3/uL (ref 0.7–4.0)
LYMPHS PCT: 34 % (ref 12–46)
MCH: 29.7 pg (ref 26.0–34.0)
MCHC: 33.4 g/dL (ref 30.0–36.0)
MCV: 88.7 fL (ref 78.0–100.0)
MPV: 9 fL (ref 8.6–12.4)
Monocytes Absolute: 0.3 10*3/uL (ref 0.1–1.0)
Monocytes Relative: 9 % (ref 3–12)
Neutro Abs: 1.9 10*3/uL (ref 1.7–7.7)
Neutrophils Relative %: 55 % (ref 43–77)
PLATELETS: 276 10*3/uL (ref 150–400)
RBC: 4.08 MIL/uL (ref 3.87–5.11)
RDW: 13 % (ref 11.5–15.5)
WBC: 3.5 10*3/uL — ABNORMAL LOW (ref 4.0–10.5)

## 2014-09-08 LAB — COMPLETE METABOLIC PANEL WITH GFR
ALBUMIN: 4.5 g/dL (ref 3.5–5.2)
ALT: 11 U/L (ref 0–35)
AST: 15 U/L (ref 0–37)
Alkaline Phosphatase: 44 U/L (ref 39–117)
BUN: 12 mg/dL (ref 6–23)
CHLORIDE: 100 meq/L (ref 96–112)
CO2: 28 mEq/L (ref 19–32)
CREATININE: 0.6 mg/dL (ref 0.50–1.10)
Calcium: 9.9 mg/dL (ref 8.4–10.5)
GLUCOSE: 79 mg/dL (ref 70–99)
POTASSIUM: 3.8 meq/L (ref 3.5–5.3)
SODIUM: 137 meq/L (ref 135–145)
Total Bilirubin: 0.8 mg/dL (ref 0.2–1.2)
Total Protein: 7.8 g/dL (ref 6.0–8.3)

## 2014-09-08 LAB — LIPID PANEL
CHOL/HDL RATIO: 4.9 ratio
CHOLESTEROL: 252 mg/dL — AB (ref 0–200)
HDL: 51 mg/dL (ref >=46)
LDL CALC: 167 mg/dL — AB (ref 0–99)
TRIGLYCERIDES: 168 mg/dL — AB (ref <150)
VLDL: 34 mg/dL (ref 0–40)

## 2014-09-08 LAB — POCT GLYCOSYLATED HEMOGLOBIN (HGB A1C): Hemoglobin A1C: 5.3

## 2014-09-08 LAB — CERVICOVAGINAL ANCILLARY ONLY: Wet Prep (BD Affirm): NEGATIVE

## 2014-09-08 LAB — HIV ANTIBODY (ROUTINE TESTING W REFLEX): HIV: NONREACTIVE

## 2014-09-08 LAB — TSH: TSH: 1.054 u[IU]/mL (ref 0.350–4.500)

## 2014-09-08 NOTE — Progress Notes (Signed)
Patient ID: Rhonda Peterson, female   DOB: 1950-11-06, 64 y.o.   MRN: [id number]   Rhonda Peterson, is a 64 y.o. female  [id number]  [id number]  DOB - 07-28-1950  Chief Complaint  Patient presents with  . Follow-up        Subjective:   Rhonda Peterson is a 64 y.o. female here today for a follow up visit. Patient has history of major depression and chronic migraine headache, here today for her annual physical examination. She is due for Pap smear, colonoscopy and mammogram. Patient has not been seen in our clinic since January 2015. Patient has no new complaint today. She is currently not on any medication for chronic illness. She occasionally takes multivitamins from over-the-counter. Patient has No headache, No chest pain, No abdominal pain - No Nausea, No new weakness tingling or numbness, No Cough - SOB.  Problem  Pap Smear for Cervical Cancer Screening    ALLERGIES: No Known Allergies  PAST MEDICAL HISTORY: Past Medical History  Diagnosis Date  . Concussion   . Depression   . Headache(784.0)   . Migraine     MEDICATIONS AT HOME: Prior to Admission medications   Medication Sig Start Date End Date Taking? Authorizing Provider  IBUPROFEN PO Take 2 tablets by mouth every 6 (six) hours as needed (pain).   Yes Historical Provider, MD  diphenhydrAMINE (BENADRYL) 25 MG tablet Take 25 mg by mouth every 6 (six) hours as needed for itching.    Historical Provider, MD  Multiple Vitamins-Minerals (HAIR VITAMINS PO) Take 1 tablet by mouth daily.    Historical Provider, MD  permethrin (ELIMITE) 5 % cream Apply to affected as directed Patient not taking: Reported on 09/08/2014 11/08/13   Lajean Saver, MD     Objective:   Filed Vitals:   09/08/14 1036  BP: 132/88  Pulse: 77  Temp: 98 F (36.7 C)  TempSrc: Oral  Resp: 16  Height: 5\' 4"  (1.626 m)  Weight: 148 lb (67.132 kg)  SpO2: 100%    Exam General appearance : Awake, alert, not in any distress. Speech Clear. Not  toxic looking HEENT: Atraumatic and Normocephalic, pupils equally reactive to light and accomodation Neck: supple, no JVD. No cervical lymphadenopathy.  Chest:Good air entry bilaterally, no added sounds  CVS: S1 S2 regular, no murmurs.  Abdomen: Bowel sounds present, Non tender and not distended with no gaurding, rigidity or rebound. Extremities: B/L Lower Ext shows no edema, both legs are warm to touch Neurology: Awake alert, and oriented X 3, CN II-XII intact, Non focal  Pelvic Exam: Cervix normal in appearance, external genitalia normal, no adnexal masses or tenderness, no cervical motion tenderness, rectovaginal septum normal, uterus normal size, shape, and consistency and vagina normal without discharge    Data Review No results found for: HGBA1C   Assessment & Plan   1. Pap smear for cervical cancer screening  - Cytology - PAP - Cervicovaginal ancillary only  2. Annual physical exam  - CBC with Differential/Platelet - COMPLETE METABOLIC PANEL WITH GFR - POCT glycosylated hemoglobin (Hb A1C) - Lipid panel - TSH - Urinalysis, Complete - MM Digital Screening; Future - HM COLONOSCOPY - Ambulatory referral to Gastroenterology - HIV antibody (with reflex)  Patient have been counseled extensively about nutrition and exercise Return in about 6 months (around 03/11/2015), or if symptoms worsen or fail to improve, for Routine Follow Up.  The patient was given clear instructions to go to ER or return to medical center if symptoms don't  improve, worsen or new problems develop. The patient verbalized understanding. The patient was told to call to get lab results if they haven't heard anything in the next week.   This note has been created with Surveyor, quantity. Any transcriptional errors are unintentional.    Angelica Chessman, MD, [place], [place], [place], [place] and [facility], [place]     09/08/2014, 11:16 AM

## 2014-09-08 NOTE — Progress Notes (Signed)
Pt is here today for a physical and a pap smear.

## 2014-09-09 LAB — URINALYSIS, COMPLETE
BILIRUBIN URINE: NEGATIVE
Casts: NONE SEEN
Crystals: NONE SEEN
GLUCOSE, UA: NEGATIVE mg/dL
Hgb urine dipstick: NEGATIVE
Ketones, ur: NEGATIVE mg/dL
Nitrite: NEGATIVE
Protein, ur: NEGATIVE mg/dL
Specific Gravity, Urine: 1.02 (ref 1.005–1.030)
Squamous Epithelial / LPF: NONE SEEN
Urobilinogen, UA: 0.2 mg/dL (ref 0.0–1.0)
pH: 7.5 (ref 5.0–8.0)

## 2014-09-09 LAB — CERVICOVAGINAL ANCILLARY ONLY
Chlamydia: NEGATIVE
Neisseria Gonorrhea: NEGATIVE

## 2014-09-10 LAB — CYTOLOGY - PAP

## 2014-09-14 ENCOUNTER — Telehealth: Payer: Self-pay

## 2014-09-14 MED ORDER — SIMVASTATIN 20 MG PO TABS: 20.0000 mg | ORAL_TABLET | Freq: Every day | ORAL | Status: DC

## 2014-09-14 NOTE — Telephone Encounter (Signed)
-----   Message from Tresa Garter, MD sent at 09/11/2014  5:37 PM EDT ----- Please inform patient had laboratory tests all within normal limits except for her cholesterol level that is high,To address this please limit saturated fat to no more than 7% of your calories, limit cholesterol to 200 mg/day, increase fiber and exercise as tolerated. If needed we may add another cholesterol lowering medication to your regimen. We'll still need to start her on low dose cholesterol medication.  Please call in prescription simvastatin 20 mg tablet by mouth daily, 90 tablets with 3 refills.

## 2014-09-14 NOTE — Telephone Encounter (Signed)
Attempted to call patient  Phone number listed is not in service Unable to leave message Prescription sent to pharmacy

## 2014-10-07 ENCOUNTER — Ambulatory Visit: Payer: Medicare Other

## 2014-10-14 ENCOUNTER — Encounter: Payer: Self-pay | Admitting: Internal Medicine

## 2015-01-03 ENCOUNTER — Encounter (HOSPITAL_COMMUNITY): Payer: Self-pay | Admitting: Nurse Practitioner

## 2015-01-03 ENCOUNTER — Emergency Department (HOSPITAL_COMMUNITY): Payer: Medicare Other

## 2015-01-03 ENCOUNTER — Emergency Department (HOSPITAL_COMMUNITY)
Admission: EM | Admit: 2015-01-03 | Discharge: 2015-01-03 | Disposition: A | Discharge status: Home / Self Care | Payer: Self-pay | Attending: Emergency Medicine | Admitting: Emergency Medicine

## 2015-01-03 DIAGNOSIS — Z8781 Personal history of (healed) traumatic fracture: Secondary | ICD-10-CM | POA: Insufficient documentation

## 2015-01-03 DIAGNOSIS — Z79899 Other long term (current) drug therapy: Secondary | ICD-10-CM | POA: Insufficient documentation

## 2015-01-03 DIAGNOSIS — S59902A Unspecified injury of left elbow, initial encounter: Secondary | ICD-10-CM | POA: Diagnosis not present

## 2015-01-03 DIAGNOSIS — M25522 Pain in left elbow: Secondary | ICD-10-CM | POA: Diagnosis not present

## 2015-01-03 DIAGNOSIS — M79602 Pain in left arm: Secondary | ICD-10-CM | POA: Insufficient documentation

## 2015-01-03 DIAGNOSIS — Z8659 Personal history of other mental and behavioral disorders: Secondary | ICD-10-CM | POA: Insufficient documentation

## 2015-01-03 DIAGNOSIS — Z8679 Personal history of other diseases of the circulatory system: Secondary | ICD-10-CM | POA: Insufficient documentation

## 2015-01-03 MED ORDER — NAPROXEN 375 MG PO TABS: 375.0000 mg | ORAL_TABLET | Freq: Two times a day (BID) | ORAL | Status: DC

## 2015-01-03 MED ORDER — IBUPROFEN 400 MG PO TABS
600.0000 mg | ORAL_TABLET | Freq: Once | ORAL | Status: AC
  Administered 2015-01-03: 600 mg via ORAL
  Filled 2015-01-03: qty 2

## 2015-01-03 NOTE — ED Notes (Addendum)
Pt c/o R wrist and hand pain "for a while." she reports some old injuries to the hand but the pain became worse on Tuesday after lifting something heavy. Shes had some tingling in the hand as well. She tried OTC meds with minimal relief. Pain increased with use. Cms intact.

## 2015-01-03 NOTE — ED Provider Notes (Signed)
CSN: [id number]     Arrival date & time 01/03/15  1842 History   First MD Initiated Contact with Patient 01/03/15 1922     Chief Complaint  Patient presents with  . Wrist Pain     (Consider location/radiation/quality/duration/timing/severity/associated sxs/prior Treatment) The history is provided by the patient.   Rhonda Peterson is a 64 y.o. female who presents to the ED with left elbow pain that started 2 weeks ago. The pain radiates to the left upper arm. She has taken nothing for pain. She reports that she has not contacted her doctor because she has been keeping her grand children and hasn't had time. She had initially reported she had pain in both arms but now states the only pain is in the left. She denies chest pain, shortness of breath, nausea or other symptoms.   Past Medical History  Diagnosis Date  . Concussion   . Depression   . Headache(784.0)   . Migraine    Past Surgical History  Procedure Laterality Date  . Back surgery    . Knee surgery     History reviewed. No pertinent family history. History  Substance Use Topics  . Smoking status: Never Smoker   . Smokeless tobacco: Not on file  . Alcohol Use: No   OB History    No data available     Review of Systems negative except as stated in HPI   Allergies  Review of patient's allergies indicates no known allergies.  Home Medications   Prior to Admission medications   Medication Sig Start Date End Date Taking? Authorizing Provider  diphenhydrAMINE (BENADRYL) 25 MG tablet Take 25 mg by mouth every 6 (six) hours as needed for itching.    Historical Provider, MD  IBUPROFEN PO Take 2 tablets by mouth every 6 (six) hours as needed (pain).    Historical Provider, MD  Multiple Vitamins-Minerals (HAIR VITAMINS PO) Take 1 tablet by mouth daily.    Historical Provider, MD  naproxen (NAPROSYN) 375 MG tablet Take 1 tablet (375 mg total) by mouth 2 (two) times daily. 01/03/15   Cherylee Rawlinson Bunnie Pion, NP  permethrin (ELIMITE) 5  % cream Apply to affected as directed Patient not taking: Reported on 09/08/2014 11/08/13   Lajean Saver, MD  simvastatin (ZOCOR) 20 MG tablet Take 1 tablet (20 mg total) by mouth at bedtime. 09/14/14   Lorayne Marek, MD   BP 134/89 mmHg  Pulse 73  Temp(Src) 97.9 F (36.6 C) (Oral)  Resp 18  SpO2 100% Physical Exam  Constitutional: She is oriented to person, place, and time. She appears well-developed and well-nourished.  HENT:  Head: Normocephalic and atraumatic.  Eyes: EOM are normal.  Neck: Normal range of motion. Neck supple.  Cardiovascular: Normal rate and regular rhythm.   Pulmonary/Chest: Effort normal and breath sounds normal. She exhibits no tenderness.  Musculoskeletal: Normal range of motion.       Left elbow: She exhibits normal range of motion, no swelling, no deformity and no laceration. Tenderness found.       Arms: Radial pulses 2+ bilateral, equal grips, adequate circulation, good touch sensation.   Neurological: She is alert and oriented to person, place, and time. No cranial nerve deficit.  Skin: Skin is warm and dry.  Psychiatric: Her behavior is normal.  Nursing note and vitals reviewed.   ED Course  Procedures (including critical care time) Dr. Regenia Skeeter in to examine the patient  Labs Review Labs Reviewed - No data to display  Imaging  Review Dg Elbow Complete Left  01/03/2015   CLINICAL DATA:  Left elbow pain.  Injury earlier this day.  EXAM: LEFT ELBOW - COMPLETE 3+ VIEW  COMPARISON:  None.  FINDINGS: No fracture or dislocation. The alignment and joint spaces are maintained. No elbow joint effusion. Small calcifications adjacent to the medial humeral condyle are likely related to tendinopathy. There is otherwise no focal soft tissue abnormality.  IMPRESSION: No acute fracture or dislocation.   Electronically Signed   By: Jeb Levering M.D.   On: 01/03/2015 21:00     MDM  64 y.o. female with left elbow pain that raidiates to the upper arm x 2 weeks.  Stable for d/c with normal x-ray and no neurovascular compromise. I have reviewed this patient's vital signs, nurses notes, appropriate labs and imaging.  Although triage note states right wrist pain Dr. Regenia Skeeter and I both have discussed with the patient and she reports only pain in the left arm and elbow.  Discussed with the patient and all questioned fully answered. She will follow up with her doctor or return here if any problems arise.  Final diagnoses:  Musculoskeletal arm pain, left       [facility], NP 01/04/15 [place], MD 01/06/15 [phone]

## 2015-02-13 ENCOUNTER — Encounter (HOSPITAL_COMMUNITY): Payer: Self-pay | Admitting: Emergency Medicine

## 2015-02-13 ENCOUNTER — Emergency Department (HOSPITAL_COMMUNITY): Payer: 59

## 2015-02-13 ENCOUNTER — Emergency Department (HOSPITAL_COMMUNITY)
Admission: EM | Admit: 2015-02-13 | Discharge: 2015-02-13 | Disposition: A | Discharge status: Home / Self Care | Payer: 59 | Attending: Emergency Medicine | Admitting: Emergency Medicine

## 2015-02-13 DIAGNOSIS — Y998 Other external cause status: Secondary | ICD-10-CM | POA: Diagnosis not present

## 2015-02-13 DIAGNOSIS — M25512 Pain in left shoulder: Secondary | ICD-10-CM

## 2015-02-13 DIAGNOSIS — Z791 Long term (current) use of non-steroidal anti-inflammatories (NSAID): Secondary | ICD-10-CM | POA: Insufficient documentation

## 2015-02-13 DIAGNOSIS — Y9289 Other specified places as the place of occurrence of the external cause: Secondary | ICD-10-CM | POA: Diagnosis not present

## 2015-02-13 DIAGNOSIS — M25562 Pain in left knee: Secondary | ICD-10-CM

## 2015-02-13 DIAGNOSIS — Z8679 Personal history of other diseases of the circulatory system: Secondary | ICD-10-CM | POA: Insufficient documentation

## 2015-02-13 DIAGNOSIS — Z79899 Other long term (current) drug therapy: Secondary | ICD-10-CM | POA: Diagnosis not present

## 2015-02-13 DIAGNOSIS — W19XXXA Unspecified fall, initial encounter: Secondary | ICD-10-CM

## 2015-02-13 DIAGNOSIS — Z8659 Personal history of other mental and behavioral disorders: Secondary | ICD-10-CM | POA: Insufficient documentation

## 2015-02-13 DIAGNOSIS — Y9301 Activity, walking, marching and hiking: Secondary | ICD-10-CM | POA: Insufficient documentation

## 2015-02-13 DIAGNOSIS — S8992XA Unspecified injury of left lower leg, initial encounter: Secondary | ICD-10-CM | POA: Diagnosis not present

## 2015-02-13 DIAGNOSIS — W1839XA Other fall on same level, initial encounter: Secondary | ICD-10-CM | POA: Insufficient documentation

## 2015-02-13 DIAGNOSIS — S4992XA Unspecified injury of left shoulder and upper arm, initial encounter: Secondary | ICD-10-CM | POA: Insufficient documentation

## 2015-02-13 DIAGNOSIS — S80252A Superficial foreign body, left knee, initial encounter: Secondary | ICD-10-CM | POA: Diagnosis not present

## 2015-02-13 MED ORDER — CANE MISC: Status: AC

## 2015-02-13 NOTE — Discharge Instructions (Signed)
Recommend to use cane to help recommend to use cane to help steady her balance and reduce falls. You may take Tylenol or Motrin as needed for pain. Follow-up with your primary care physician. Return to the ED for new or worsening symptoms.

## 2015-02-13 NOTE — ED Notes (Signed)
Pt reports a fall yesterday, states she was walking and tripped. C/o pain in left shoulder and left knee. Pt was ambulatory to the room from the waiting room.

## 2015-02-13 NOTE — ED Provider Notes (Signed)
CSN: [id number]     Arrival date & time 02/13/15  1155 History   First MD Initiated Contact with Patient 02/13/15 1209     Chief Complaint  Patient presents with  . Knee Pain  . Shoulder Pain     (Consider location/radiation/quality/duration/timing/severity/associated sxs/prior Treatment) Patient is a 64 y.o. female presenting with knee pain and shoulder pain. The history is provided by the patient and medical records.  Knee Pain Shoulder Pain   This is a 64 year old female with history of migraine headaches, depression, presenting to the ED after a mechanical fall yesterday. She was walking through her home and tripped over a rug and fell onto the hardwood floor onto left knee followed by left shoulder. No head injury or loss of consciousness. Patient reports left knee pain and left shoulder pain. She has had prior knee surgeries in the past, no prior shoulder surgeries. Denies numbness or weakness of her extremities. No back or neck pain. Patient was previously followed by orthopedics, has not seen them in several years.  VSS.  Past Medical History  Diagnosis Date  . Concussion   . Depression   . Headache(784.0)   . Migraine    Past Surgical History  Procedure Laterality Date  . Back surgery    . Knee surgery    . Hand surgery Bilateral    No family history on file. Social History  Substance Use Topics  . Smoking status: Never Smoker   . Smokeless tobacco: None  . Alcohol Use: No   OB History    No data available     Review of Systems  Musculoskeletal: Positive for arthralgias.  All other systems reviewed and are negative.     Allergies  Review of patient's allergies indicates no known allergies.  Home Medications   Prior to Admission medications   Medication Sig Start Date End Date Taking? Authorizing Provider  diphenhydrAMINE (BENADRYL) 25 MG tablet Take 25 mg by mouth every 6 (six) hours as needed for itching.    Historical Provider, MD  IBUPROFEN PO Take 2  tablets by mouth every 6 (six) hours as needed (pain).    Historical Provider, MD  Multiple Vitamins-Minerals (HAIR VITAMINS PO) Take 1 tablet by mouth daily.    Historical Provider, MD  naproxen (NAPROSYN) 375 MG tablet Take 1 tablet (375 mg total) by mouth 2 (two) times daily. 01/03/15   Hope Bunnie Pion, NP  permethrin (ELIMITE) 5 % cream Apply to affected as directed Patient not taking: Reported on 09/08/2014 11/08/13   Lajean Saver, MD  simvastatin (ZOCOR) 20 MG tablet Take 1 tablet (20 mg total) by mouth at bedtime. 09/14/14   Lorayne Marek, MD   BP 146/86 mmHg  Pulse 96  Temp(Src) 98.1 F (36.7 C) (Oral)  Resp 16  SpO2 100%   Physical Exam  Constitutional: She is oriented to person, place, and time. She appears well-developed and well-nourished.  HENT:  Head: Normocephalic and atraumatic.  Mouth/Throat: Oropharynx is clear and moist.  Eyes: Conjunctivae and EOM are normal. Pupils are equal, round, and reactive to light.  Neck: Normal range of motion.  Cardiovascular: Normal rate, regular rhythm and normal heart sounds.   Pulmonary/Chest: Effort normal and breath sounds normal.  Abdominal: Soft. Bowel sounds are normal.  Musculoskeletal:       Left shoulder: She exhibits decreased range of motion (due to pain) and pain. She exhibits no tenderness, no bony tenderness, no swelling, no deformity and no laceration.  Left knee: Normal. She exhibits normal range of motion, no swelling and no effusion. No tenderness found.  Left knee normal in appearance; reports generalized pain but no focal TTP or deformities; no significant swelling noted Left shoulder without focal tenderness; decreased ROM above head due to pain; no acute deformity or signs of dislocation Normal strength and sensation of extremities x4, pulses intact x4  Neurological: She is alert and oriented to person, place, and time.  Skin: Skin is warm and dry.  Psychiatric: She has a normal mood and affect.  Nursing note and  vitals reviewed.   ED Course  Procedures (including critical care time) Labs Review Labs Reviewed - No data to display  Imaging Review Dg Shoulder Left  02/13/2015   CLINICAL DATA:  Status post fall yesterday.  Left shoulder pain.  EXAM: LEFT SHOULDER - 2+ VIEW  COMPARISON:  None.  FINDINGS: There is no fracture or dislocation. There are mild degenerative changes of the acromioclavicular joint.  IMPRESSION: No acute osseous injury of the left shoulder.   Electronically Signed   By: Kathreen Devoid   On: 02/13/2015 13:15   Dg Knee Complete 4 Views Left  02/13/2015   CLINICAL DATA:  64 year old female with history of trauma from a fall yesterday while walking. Left knee pain.  EXAM: LEFT KNEE - COMPLETE 4+ VIEW  COMPARISON:  09/11/2013.  FINDINGS: No acute displaced fracture, subluxation or dislocation. There is joint space narrowing, subchondral sclerosis, subchondral cyst formation and osteophyte formation, most severe in the medial and patellofemoral compartments. 2 small loose bodies are noted in the intercondylar notch.  IMPRESSION: 1. No acute radiographic abnormality of the left knee. 2. 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 3. Two small loose bodies in the intercondylar notch.   Electronically Signed   By: Vinnie Langton M.D.   On: 02/13/2015 13:08   I have personally reviewed and evaluated these images and lab results as part of my medical decision-making.   EKG Interpretation None      MDM   Final diagnoses:  Fall, initial encounter  Left knee pain  Left shoulder pain    64 year old female here with mechanical fall yesterday. Landed on left knee and left shoulder. No head injury or loss of consciousness. Patient reports left knee and shoulder pain. Prior knee surgeries in the past. Exam findings as above in no acute bony deformities. Extremities are neurovascularly intact. X-rays negative for acute bony findings. Patient  reassured and discharged home. I've written a prescription for cane as she feels this may help to steady her gait. She will follow-up with her primary care physician.  Discussed plan with patient, he/she acknowledged understanding and agreed with plan of care.  Return precautions given for new or worsening symptoms.  Case discussed with attending physician, Dr. Winfred Leeds, who evaluated patient and agrees with assessment and plan of care.  Larene Pickett, PA-C 02/13/15 1508  Orlie Dakin, MD 02/13/15 [phone]

## 2015-02-13 NOTE — ED Provider Notes (Signed)
Patient fell while walking yesterday. She complained of slight left shoulder pain and left knee pain. Pain is minimal at present. Patient is alert ambulatory without difficulty. Moves all extremities well. All 4 extremities neurovascularly intact  Orlie Dakin, MD 02/13/15 1407

## 2015-02-13 NOTE — ED Notes (Signed)
Patient transported to X-ray 

## 2015-06-07 DIAGNOSIS — Z23 Encounter for immunization: Secondary | ICD-10-CM | POA: Diagnosis not present

## 2015-08-17 DIAGNOSIS — H2513 Age-related nuclear cataract, bilateral: Secondary | ICD-10-CM | POA: Diagnosis not present

## 2015-08-17 DIAGNOSIS — H40033 Anatomical narrow angle, bilateral: Secondary | ICD-10-CM | POA: Diagnosis not present

## 2015-12-02 ENCOUNTER — Emergency Department (HOSPITAL_COMMUNITY): Payer: Medicare Other

## 2015-12-02 ENCOUNTER — Encounter (HOSPITAL_COMMUNITY): Payer: Self-pay | Admitting: Family Medicine

## 2015-12-02 ENCOUNTER — Emergency Department (HOSPITAL_COMMUNITY)
Admission: EM | Admit: 2015-12-02 | Discharge: 2015-12-02 | Disposition: A | Discharge status: Home / Self Care | Payer: Medicare Other | Attending: Emergency Medicine | Admitting: Emergency Medicine

## 2015-12-02 DIAGNOSIS — S0990XA Unspecified injury of head, initial encounter: Secondary | ICD-10-CM | POA: Diagnosis not present

## 2015-12-02 DIAGNOSIS — Y929 Unspecified place or not applicable: Secondary | ICD-10-CM | POA: Insufficient documentation

## 2015-12-02 DIAGNOSIS — Z79899 Other long term (current) drug therapy: Secondary | ICD-10-CM | POA: Insufficient documentation

## 2015-12-02 DIAGNOSIS — Y999 Unspecified external cause status: Secondary | ICD-10-CM | POA: Diagnosis not present

## 2015-12-02 DIAGNOSIS — W51XXXA Accidental striking against or bumped into by another person, initial encounter: Secondary | ICD-10-CM | POA: Insufficient documentation

## 2015-12-02 DIAGNOSIS — Y939 Activity, unspecified: Secondary | ICD-10-CM | POA: Diagnosis not present

## 2015-12-02 DIAGNOSIS — J069 Acute upper respiratory infection, unspecified: Secondary | ICD-10-CM | POA: Insufficient documentation

## 2015-12-02 MED ORDER — CETIRIZINE HCL 10 MG PO TABS: 10.0000 mg | ORAL_TABLET | Freq: Every day | ORAL | Status: DC
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
## 2015-12-02 NOTE — Discharge Instructions (Signed)
[employer] Guide Abuse & Neglect The United Ways 211 is a great source of information about community services available.  Access by dialing 2-1-1 from anywhere in [place], or by website -  PooledIncome.pl.  [employer] Social Services The United Ways 211 is a great source of information about community services available.  Access by dialing 2-1-1 from anywhere in [place], or by website -  PooledIncome.pl.   Other Local Resources (Updated 06/2015)  Social  Contractor Number and Address  Aging, Disability and Transit Services  In-home services  Meals on wheels  Community meal sites  Transportation services  Adult day health care  Center for Active Retirement  Family caregiver support services [phone] [place], [place]  [place] [employer] of Social Services  Aging and adult services  Childrens services  Deaf-Blind services  Disability services  Guardianship  Hearing loss (assistive technology, interpreters, etc.)  Low-income services (health care, child care, housing, financial and nutrition assistance)  Medicaid  Pregnancy services  Utility assistance  Veterans services [phone] [address] [place], [place] [address]   [place] [facility]  Assessment  Care management  Family education  Information and referral [phone] [address] [place], [place] [address]  [facility] Social Services  Aging and adult services  Childrens services  Deaf-Blind services  Disability services  Guardianship  Hearing loss (assistive technology, interpreters, etc.)  Low-income services (health care, child care, housing, financial and nutrition assistance)  Medicaid  Pregnancy services  Utility assistance  Veterans services [phone] [address] [place], [place] [address]  [facility] Department of Social Services  Aging and adult  services  Childrens services  Deaf-Blind services  Disability services  Guardianship  Hearing loss (assistive technology, interpreters, etc.)  Low-income services (health care, child care, housing, financial and nutrition assistance)  [facility]  Pregnancy services  Utility assistance  Veterans services [phone] [address] [place], [place] [address]  [facility] Department of Health and [employer]  Aging and adult services  Childrens services  Deaf-Blind services  Disability services  Guardianship  Hearing loss (assistive technology, interpreters, etc.)  Low-income services (health care, child care, housing, financial and nutrition assistance)  Medicaid  Pregnancy services  Utility assistance  Veterans services [phone] [address] [place], [place] [address]   [facility] Department of Social Services  Aging and adult services  Childrens services  Deaf-Blind services  Disability services  Guardianship  Hearing loss (assistive technology, interpreters, etc.)  Low-income services (health care, child care, housing, financial and nutrition assistance)  Medicaid  Pregnancy services  Utility assistance  Veterans services [phone] [address], [place], [place] [address]  [facility] Division of Social Services  Aging and adult services  Childrens services  Deaf-Blind services  Disability services  Guardianship  Hearing loss (assistive technology, interpreters, etc.)  Low-income services (health care, child care, housing, financial and nutrition assistance)  Medicaid  Pregnancy services  Utility assistance  Veterans services [phone] [address] [place], [place] [address]  [employer] of [place]  Serves adults age 68 and over and their families  Caregiver information  Foster grandparents  Forensic scientist meals  Refugee programs  Retired  Herbalist (RSVP)  Customer service manager Information Program [facility])  Administrator, Civil Service Program  SeniorLine  Support Groups  TeleCare [phone]  [address] [place], [place] [address]  [phone]  [address]  [place], [place] [address]  [employer]  Help Line  Home Care  Living at [employer] on [place] Partner Workshops  Referrals for chore and homemaker services [phone]  [address] [place], [place] [address]  [employer] assistance  Medication assistance  Rental assistance  Food pantry  Medication assistance  Housing assistance  Emergency food distribution  Utility assistance  [facility] and [place] [address] [place], [place] [phone] and Thursdays from [place] - 12 noon by appointment only) [address] [place], [place] [address]  [phone] [address] [place], [place] [address]     Other Local Resources (Updated 06/2015)  Abuse and  Neglect     Services     Phone Number and Address  [place]: Child/ Elder Abuse  Hotline - call 24 hours a day, 7 days a week to report abuse and neglect or children or adults [place]. [place], [place] [address]  [place]: Child/Elder Abuse   Hotline - call 24 hours a day, 7 days a week to report abuse and neglect of children or adults [place] [address] [place], [place] [address]  [place] [facility]   Hotline - call 24 hours a day, 7 days a week for support for people who have been sexually assaulted    Confidential counseling [phone] [place], [place] [address]  Family  Abuse Services of [place] - call 24-hours a day, 7 days a week for support and emergency housing for women dealing with domestic violence and their children  Temporary housing  Support in court  Supervised visitation  Support groups [place]: Child/Elder Abuse   Hotline - call 24 hours a day, 7 days a week to report abuse and neglect or children or adults [place], [place] [address]  [place]: Child/Elder Abuse  Hotline - call 24 hours a day, 7 days a week to report abuse and neglect or children or adults [phone] (after-hours) [place], [place] [address]  National Domestic Violence Hotline   Hotline - call 24-hours a day, 7 days a week for support and emergency housing for people dealing with domestic violence [place]: Child/Elder Abuse   Hotline - call 24 hours a day, 7 days a week to report abuse and neglect or children or adults [place]. [address], [place], [place] [address]  [place]: Child/ Elder Abuse  Hotline - call 24 hours a day, 7 days a week to report abuse and neglect or children or adults [phone] (after-hours) [facility] [place], [place] [address]

## 2016-02-24 DIAGNOSIS — Z23 Encounter for immunization: Secondary | ICD-10-CM | POA: Diagnosis not present

## 2016-06-28 ENCOUNTER — Ambulatory Visit: Payer: Medicare Other | Admitting: Internal Medicine

## 2016-07-12 ENCOUNTER — Ambulatory Visit: Payer: Medicare Other | Attending: Internal Medicine | Admitting: Internal Medicine

## 2016-07-12 ENCOUNTER — Encounter: Payer: Self-pay | Admitting: Internal Medicine

## 2016-07-12 ENCOUNTER — Encounter: Payer: Self-pay | Admitting: Licensed Clinical Social Worker

## 2016-07-12 VITALS — BP 123/81 | HR 87 | Temp 97.7°F | Ht 64.0 in | Wt 150.0 lb

## 2016-07-12 DIAGNOSIS — T7491XA Unspecified adult maltreatment, confirmed, initial encounter: Secondary | ICD-10-CM

## 2016-07-12 DIAGNOSIS — Z1211 Encounter for screening for malignant neoplasm of colon: Secondary | ICD-10-CM | POA: Insufficient documentation

## 2016-07-12 DIAGNOSIS — F329 Major depressive disorder, single episode, unspecified: Secondary | ICD-10-CM | POA: Insufficient documentation

## 2016-07-12 DIAGNOSIS — Z1231 Encounter for screening mammogram for malignant neoplasm of breast: Secondary | ICD-10-CM | POA: Diagnosis not present

## 2016-07-12 DIAGNOSIS — Z0000 Encounter for general adult medical examination without abnormal findings: Secondary | ICD-10-CM

## 2016-07-12 DIAGNOSIS — Z79899 Other long term (current) drug therapy: Secondary | ICD-10-CM | POA: Diagnosis not present

## 2016-07-12 DIAGNOSIS — E785 Hyperlipidemia, unspecified: Secondary | ICD-10-CM | POA: Diagnosis not present

## 2016-07-12 LAB — CBC WITH DIFFERENTIAL/PLATELET
Basophils Absolute: 34 cells/uL (ref 0–200)
Basophils Relative: 1 %
Eosinophils Absolute: 34 cells/uL (ref 15–500)
Eosinophils Relative: 1 %
HCT: 37 % (ref 35.0–45.0)
Hemoglobin: 12 g/dL (ref 11.7–15.5)
Lymphocytes Relative: 31 %
Lymphs Abs: 1054 cells/uL (ref 850–3900)
MCH: 28.9 pg (ref 27.0–33.0)
MCHC: 32.4 g/dL (ref 32.0–36.0)
MCV: 89.2 fL (ref 80.0–100.0)
MONOS PCT: 6 %
MPV: 9 fL (ref 7.5–12.5)
Monocytes Absolute: 204 cells/uL (ref 200–950)
NEUTROS PCT: 61 %
Neutro Abs: 2074 cells/uL (ref 1500–7800)
Platelets: 288 10*3/uL (ref 140–400)
RBC: 4.15 MIL/uL (ref 3.80–5.10)
RDW: 13.5 % (ref 11.0–15.0)
WBC: 3.4 10*3/uL — AB (ref 3.8–10.8)

## 2016-07-12 LAB — COMPLETE METABOLIC PANEL WITH GFR
ALT: 11 U/L (ref 6–29)
AST: 15 U/L (ref 10–35)
Albumin: 4.4 g/dL (ref 3.6–5.1)
Alkaline Phosphatase: 43 U/L (ref 33–130)
BUN: 13 mg/dL (ref 7–25)
CALCIUM: 9.4 mg/dL (ref 8.6–10.4)
CHLORIDE: 107 mmol/L (ref 98–110)
CO2: 27 mmol/L (ref 20–31)
Creat: 0.69 mg/dL (ref 0.50–0.99)
GFR, Est African American: >89 mL/min (ref >=60)
GFR, Est Non African American: >89 mL/min (ref >=60)
Glucose, Bld: 87 mg/dL (ref 65–99)
Potassium: 3.9 mmol/L (ref 3.5–5.3)
Sodium: 145 mmol/L (ref 135–146)
Total Bilirubin: 1 mg/dL (ref 0.2–1.2)
Total Protein: 7.8 g/dL (ref 6.1–8.1)

## 2016-07-12 LAB — LIPID PANEL
CHOL/HDL RATIO: 5.6 ratio — AB (ref <5.0)
Cholesterol: 295 mg/dL — ABNORMAL HIGH (ref <200)
HDL: 53 mg/dL (ref >50)
LDL Cholesterol: 222 mg/dL — ABNORMAL HIGH (ref <100)
Triglycerides: 100 mg/dL (ref <150)
VLDL: 20 mg/dL (ref <30)
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
## 2016-07-12 NOTE — Progress Notes (Signed)
Pt is here today for a physical.
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
## 2016-07-12 NOTE — Patient Instructions (Signed)

## 2016-07-19 ENCOUNTER — Ambulatory Visit: Payer: Medicare Other | Attending: Internal Medicine | Admitting: Internal Medicine

## 2016-07-19 ENCOUNTER — Encounter: Payer: Self-pay | Admitting: Internal Medicine

## 2016-07-19 VITALS — BP 118/72 | HR 85 | Temp 98.1°F | Resp 18 | Ht 63.0 in | Wt 150.4 lb

## 2016-07-19 DIAGNOSIS — R413 Other amnesia: Secondary | ICD-10-CM | POA: Insufficient documentation

## 2016-07-19 DIAGNOSIS — Z79899 Other long term (current) drug therapy: Secondary | ICD-10-CM | POA: Diagnosis not present

## 2016-07-19 DIAGNOSIS — Z024 Encounter for examination for driving license: Secondary | ICD-10-CM | POA: Diagnosis not present

## 2016-07-19 DIAGNOSIS — Z9189 Other specified personal risk factors, not elsewhere classified: Secondary | ICD-10-CM
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
## 2016-07-19 NOTE — Patient Instructions (Signed)

## 2016-07-21 ENCOUNTER — Other Ambulatory Visit: Payer: Self-pay | Admitting: Internal Medicine

## 2016-07-21 MED ORDER — SIMVASTATIN 40 MG PO TABS: 40.0000 mg | ORAL_TABLET | Freq: Every day | ORAL | 3 refills | Status: DC

## 2016-08-03 ENCOUNTER — Telehealth: Payer: Self-pay | Admitting: *Deleted

## 2016-08-03 NOTE — Telephone Encounter (Signed)
-----   Message from Tresa Garter, MD sent at 07/21/2016 10:52 AM EST ----- Please inform patient that her lab results are normal except for high cholesterol. Cholesterol medicine prescribed to the pharmacy for pick-up. Please limit saturated fat to no more than 7% of your calories, limit cholesterol to 200 mg/day, increase fiber and exercise as tolerated.

## 2016-08-03 NOTE — Telephone Encounter (Signed)
Patient verified DOB Patient is aware of labs being normal except for cholesterol level being high. Patient is aware of simvastatin being sent to the [facility] and was advised to take medication as prescribed and to limit saturated fat intake while increasing fiber and exercise. Patient expressed her understanding and had no further questions at this time.

## 2016-08-14 ENCOUNTER — Encounter: Payer: Self-pay | Admitting: Diagnostic Neuroimaging

## 2016-08-14 ENCOUNTER — Ambulatory Visit (INDEPENDENT_AMBULATORY_CARE_PROVIDER_SITE_OTHER): Payer: Medicare Other | Admitting: Diagnostic Neuroimaging

## 2016-08-14 VITALS — BP 122/72 | HR 90 | Ht 64.0 in | Wt 142.4 lb

## 2016-08-14 DIAGNOSIS — F0391 Unspecified dementia with behavioral disturbance: Secondary | ICD-10-CM | POA: Diagnosis not present

## 2016-08-14 DIAGNOSIS — F03B18 Unspecified dementia, moderate, with other behavioral disturbance: Secondary | ICD-10-CM
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
## 2016-08-14 NOTE — Patient Instructions (Addendum)
Thank you for coming to see [place] at [facility]. I hope we have been able to provide you high quality care today.  You may receive a patient satisfaction survey over the next few weeks. We would appreciate your feedback and comments so that we may continue to improve ourselves and the health of our patients.   - no driving  - focus on increasing safety and supervision    ~~~~~~~~~~~~~~~~~~~~~~~~~~~~~~~~~~~~~~~~~~~~~~~~~~~~~~~~~~~~~~~~~  DR. Raymie Trani'S GUIDE TO HAPPY AND HEALTHY LIVING These are some of my general health and wellness recommendations. Some of them may apply to you better than others. Please use common sense as you try these suggestions and feel free to ask me any questions.   ACTIVITY/FITNESS Mental, social, emotional and physical stimulation are very important for brain and body health. Try learning a new activity (arts, music, language, sports, games).  Keep moving your body to the best of your abilities. You can do this at home, inside or outside, the park, community center, gym or anywhere you like. Consider a physical therapist or personal trainer to get started. Consider the app Sworkit. Fitness trackers such as smart-watches, smart-phones or Fitbits can help as well.   NUTRITION Eat more plants: colorful vegetables, nuts, seeds and berries.  Eat less sugar, salt, preservatives and processed foods.  Avoid toxins such as cigarettes and alcohol.  Drink water when you are thirsty. Warm water with a slice of lemon is an excellent morning drink to start the day.  Consider these websites for more information The Nutrition Source (https://www.henry-hernandez.biz/) Precision Nutrition (WindowBlog.ch)   RELAXATION Consider practicing mindfulness meditation or other relaxation techniques such as deep breathing, prayer, yoga, tai chi, massage. See website mindful.org or the apps Headspace or Calm to help get  started.   SLEEP Try to get at least 7-8+ hours sleep per day. Regular exercise and reduced caffeine will help you sleep better. Practice good sleep hygeine techniques. See website sleep.org for more information.   PLANNING Prepare estate planning, living will, healthcare POA documents. Sometimes this is best planned with the help of an attorney. Theconversationproject.org and agingwithdignity.org are excellent resources.

## 2016-08-15 DIAGNOSIS — H40033 Anatomical narrow angle, bilateral: Secondary | ICD-10-CM | POA: Diagnosis not present

## 2016-08-15 DIAGNOSIS — H2513 Age-related nuclear cataract, bilateral: Secondary | ICD-10-CM | POA: Diagnosis not present

## 2016-09-05 ENCOUNTER — Telehealth: Payer: Self-pay | Admitting: Diagnostic Neuroimaging

## 2016-09-05 NOTE — Telephone Encounter (Signed)
Agree with plan. --VRP 

## 2016-09-05 NOTE — Telephone Encounter (Signed)
Pt husband calling asking if a prescription can be called in due to a change in pt behavior, mood swings and making false accusations.

## 2016-09-05 NOTE — Telephone Encounter (Signed)
Called patient's sig other, Mr Candis Schatz and reviewed Dr Penumalli;'s plan per office note from patient's last visit. Advised him that the medications Dr Leta Baptist would consider would not reverse her dementia or help with mood swings, or paranoia.  Advised that due to her reluctance to take medication and her paranoia, Dr Leta Baptist felt it may not be beneficial to prescribe.  Advised him to discuss concerns with PCP. He stated he didn't really know if "that dr was her PCP".  Advised that patient has been seeing Dr Doreene Burke for a number of years.  Reminded him of resources packet he was given at her office visit, strongly encouraged him to contact those resources for information, support, suggestions. This RN stated she would discuss phone call with Dr Leta Baptist and call him back if any further information. Also advised if patient becomes worse, develops new symptoms he may contact our office for another appointment. He verbalized understanding, appreciation.

## 2016-10-18 ENCOUNTER — Ambulatory Visit: Payer: Medicare Other | Attending: Internal Medicine | Admitting: Internal Medicine

## 2016-10-18 ENCOUNTER — Other Ambulatory Visit: Payer: Self-pay | Admitting: Internal Medicine

## 2016-10-18 ENCOUNTER — Encounter: Payer: Self-pay | Admitting: Internal Medicine

## 2016-10-18 VITALS — BP 109/65 | HR 80 | Temp 97.7°F | Resp 18 | Ht 64.0 in | Wt 150.0 lb

## 2016-10-18 DIAGNOSIS — F329 Major depressive disorder, single episode, unspecified: Secondary | ICD-10-CM | POA: Diagnosis not present

## 2016-10-18 DIAGNOSIS — R413 Other amnesia: Secondary | ICD-10-CM

## 2016-10-18 DIAGNOSIS — R21 Rash and other nonspecific skin eruption: Secondary | ICD-10-CM | POA: Insufficient documentation

## 2016-10-18 DIAGNOSIS — F419 Anxiety disorder, unspecified: Secondary | ICD-10-CM | POA: Diagnosis not present

## 2016-10-18 DIAGNOSIS — Z1231 Encounter for screening mammogram for malignant neoplasm of breast: Secondary | ICD-10-CM | POA: Diagnosis not present

## 2016-10-18 DIAGNOSIS — Z1211 Encounter for screening for malignant neoplasm of colon: Secondary | ICD-10-CM

## 2016-10-18 DIAGNOSIS — Z79899 Other long term (current) drug therapy: Secondary | ICD-10-CM | POA: Diagnosis not present

## 2016-10-18 MED ORDER — TRIAMCINOLONE ACETONIDE 0.5 % EX OINT: 1.0000 "application " | TOPICAL_OINTMENT | Freq: Two times a day (BID) | CUTANEOUS | 0 refills | Status: DC

## 2016-10-18 NOTE — Patient Instructions (Signed)
Dementia Dementia means losing some of your brain ability. People with dementia may have problems with:  Memory.  Making decisions.  Behavior.  Speaking.  Thinking.  Solving problems. Follow these instructions at home: Medicine   Take over-the-counter and prescription medicines only as told by your doctor.  Avoid taking medicines that can change how you think. These include pain or sleeping medicines. Lifestyle    Make healthy choices:  Be active as told by your doctor.  Do not use any tobacco products, such as cigarettes, chewing tobacco, and e-cigarettes. If you need help quitting, ask your doctor.  Eat a healthy diet.  When you get stressed, do something to help yourself relax. Your doctor can give you tips.  Spend time with other people.  Drink enough fluid to keep your pee (urine) clear or pale yellow.  Make sure you get good sleep. Use these tips to help you get a good night's rest:  Try not to take naps during the day.  Keep your sleeping area dark and cool.  In the few hours before you go to bed, try not to do any exercise.  Try not to have foods and drinks with caffeine in the evening. General instructions   Talk with your doctor to figure out:  What you need help with.  What your safety needs are.  If you were given a bracelet that tracks your location, make sure to wear it.  Keep all follow-up visits as told by your doctor. This is important. Contact a doctor if:  You have any new problems.  You have problems with choking or swallowing.  You have any symptoms of a different sickness. Get help right away if:  You have a fever.  You feel mixed up (confused) or more mixed up than before.  You have new sleepiness.  You have sleepiness that gets worse.  You have a hard time staying awake.  You or your family members are worried for your safety. This information is not intended to replace advice given to you by your health care  provider. Make sure you discuss any questions you have with your health care provider. Document Released: 05/11/2008 Document Revised: 11/04/2015 Document Reviewed: 02/24/2015 Elsevier Interactive Patient Education  2017 [employer].

## 2016-10-18 NOTE — Progress Notes (Signed)
Rhonda Peterson, is a 66 y.o. female  JIR:[id number]  BPZ:[id number]  DOB - [birth year]  Chief Complaint  Patient presents with  . Annual Exam       Subjective:   Rhonda Peterson is a 66 y.o. female here today for a annual physical examination. Patient said she eats well, mostly vegetable and she is physically active, she thinks she is physically fit and healthy, just wants [place] to corroborate that assertion. She however proposed that we examine her "husband" who she recently became sexually active with but suspects that he has another sexual partner. She feels she is very clean and decent and wouldn't want to contract any disease. Patient is currently experiencing depression and anxiety triggered by her memory impairment and residing with teenage grandchildren. Patient reports feelings of sadness, difficulty sleeping, negative feelings of self, difficulty concentrating, and memory loss. She receives support from her spouse and adult children Patient has No headache, No chest pain, No abdominal pain - No Nausea, No new weakness tingling or numbness, No Cough - SOB. Patient has seen neurologist for her memory impairment but could not be started on medication because of paranoia. She verbalized she does not want to take any medication because it is not "natural". She has some rashes associated with itching, she attributes this to possible insect bite. She has no known allergy.  Problem  Rash  Colon Cancer Screening    ALLERGIES: No Known Allergies  PAST MEDICAL HISTORY: Past Medical History:  Diagnosis Date  . Concussion   . Depression   . Headache(784.0)   . Memory loss   . Migraine     MEDICATIONS AT HOME: Prior to Admission medications   Medication Sig Start Date End Date Taking? Authorizing Provider  cetirizine (ZYRTEC ALLERGY) 10 MG tablet Take 1 tablet (10 mg total) by mouth daily. 12/02/15  Yes Montine Circle, PA-C  diphenhydrAMINE (BENADRYL) 25 MG tablet Take 25 mg by mouth  every 6 (six) hours as needed for itching.   Yes [provider]  IBUPROFEN PO Take 2 tablets by mouth every 6 (six) hours as needed (pain).   Yes [provider]  Multiple Vitamins-Minerals (HAIR VITAMINS PO) Take 1 tablet by mouth daily.   Yes [provider]  naproxen (NAPROSYN) 375 MG tablet Take 1 tablet (375 mg total) by mouth 2 (two) times daily. 01/03/15  Yes Neese, [place], NP  permethrin (ELIMITE) 5 % cream Apply to affected as directed 11/08/13  Yes Lajean Saver, MD  Misc. Devices (CANE) MISC Use can to help assist with ambulation and reduce falls Patient not taking: Reported on 07/12/2016 02/13/15   Larene Pickett, PA-C  simvastatin (ZOCOR) 40 MG tablet Take 1 tablet (40 mg total) by mouth at bedtime. Patient not taking: Reported on 08/14/2016 07/21/16   Tresa Garter, MD  triamcinolone ointment (KENALOG) 0.5 % Apply 1 application topically 2 (two) times daily. 10/18/16   Tresa Garter, MD    Objective:   Vitals:   10/18/16 1032  BP: 109/65  Pulse: 80  Resp: 18  Temp: 97.7 F (36.5 C)  TempSrc: Oral  SpO2: 99%  Weight: 150 lb (68 kg)  Height: 5\' 4"  (1.626 m)   Exam General appearance : Awake, alert, not in any distress. Speech Clear. Not toxic looking HEENT: Atraumatic and Normocephalic, pupils equally reactive to light and accomodation Neck: Supple, no JVD. No cervical lymphadenopathy.  Chest: Good air entry bilaterally, no added sounds  CVS: S1 S2 regular, no  murmurs.  Abdomen: Bowel sounds present, Non tender and not distended with no gaurding, rigidity or rebound. Extremities: B/L Lower Ext shows no edema, both legs are warm to touch Neurology: Awake alert, and oriented X 3, CN II-XII intact, Non focal Skin: Few erythematous areas on the upper limbs and chest area.  Data Review Lab Results  Component Value Date   HGBA1C 5.30 09/08/2014    Assessment & Plan   1. Memory impairment  Follow up with Neurologist Difficult to  treat because patient is paranoid and does not want to take any medication She wants to continue her healthy habits, eating "right" and daily exercise  2. Encounter for screening mammogram for breast cancer  - MM Digital Screening; Future  3. Colon cancer screening  - Ambulatory referral to Gastroenterology  4. Rash  - triamcinolone ointment (KENALOG) 0.5 %; Apply 1 application topically 2 (two) times daily.  Dispense: 30 g; Refill: 0  Patient have been counseled extensively about nutrition and exercise. Other issues discussed during this visit include: low cholesterol diet, weight control and daily exercise, importance of adherence with medications and regular follow-up.   Return in about 6 months (around 04/20/2017) for Routine Follow Up, Dementia.  The patient was given clear instructions to go to ER or return to medical center if symptoms don't improve, worsen or new problems develop. The patient verbalized understanding. The patient was told to call to get lab results if they haven't heard anything in the next week.   This note has been created with Surveyor, quantity. Any transcriptional errors are unintentional.    Angelica Chessman, MD, [place], [place], [place], [place] and [place], [place]   10/18/2016, 11:50 AM

## 2016-10-18 NOTE — Progress Notes (Signed)
Patient is here for Physical  Patient has not taken medication today. Patient has eaten today.  Patient complains of itchy red sites on her left wrist and chest beginning yesterday.

## 2016-10-23 ENCOUNTER — Ambulatory Visit: Payer: Medicare Other | Attending: Internal Medicine | Admitting: Licensed Clinical Social Worker

## 2016-10-23 ENCOUNTER — Ambulatory Visit: Payer: Medicare Other

## 2016-10-23 DIAGNOSIS — R413 Other amnesia: Secondary | ICD-10-CM

## 2016-10-23 NOTE — BH Specialist Note (Signed)
Integrated Behavioral Health Follow Up Visit  MRN: [id number] Name: Rhonda Peterson   Session Start time: 3:00 PM Session End time: 3:50 PM Total time: 50 minutes Number of Integrated Behavioral Health Clinician visits: 2/10  Type of Service: [place] Interpretor:No. Interpretor Name and Language: N/A   Warm Hand Off Completed.       SUBJECTIVE: LAWRENCE ROLDAN is a 66 y.o. female accompanied by patient and significant other. Patient was referred by Dr. Doreene Burke for depression, anxiety, and community resources. Patient reports the following symptoms/concerns: feelings of sadness, difficulty sleeping, negative feelings of self, difficulty concentrating, and memory loss Duration of problem: Approx six months; Severity of problem: mild  OBJECTIVE: Mood: Anxious and Affect: Appropriate Risk of harm to self or others: No plan to harm self or others   LIFE CONTEXT: Family and Social: Pt resides with her husband, adult son, and three grandsons. Pt has two adult daughters who reside nearby and four additional grandchildren.  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 They provide financial support to their adult children. Self-Care: Pt enjoys drawing and taking walks. No report of substance use Life Changes: Pt stated that her relationship with her daughter has improved from her last visit with LCSWA. Pt reports feeling safe in the home. She stated that her anxiety stems from caring for her teenage grandchildren.  GOALS ADDRESSED: Patient will reduce symptoms of: anxiety and depression and increase knowledge and/or ability of: coping skills and stress reduction and also: Increase healthy adjustment to current life circumstances  INTERVENTIONS: Solution-Focused Strategies, Supportive Counseling, Psychoeducation and/or Health Education and Link to [employer] Standardized Assessments completed: GAD-7 and PHQ  2&9  ASSESSMENT: Patient currently experiencing depression and anxiety triggered by memory impairment and residing with teenage grandchildren. Patient reports feelings of sadness, difficulty sleeping, negative feelings of self, difficulty concentrating, and memory loss. She receives support from her spouse and adult children. Patient may benefit from medication management and psychotherapy. LCSWA educated family on how stress can negatively impact one's mental and physical health and the benefits of implementing coping skills. Pt is not open to medication management or therapy; however, identified healthy strategies to apply on a daily basis. Pt's spouse assists in providing transportation and managing pt's medical appointments. LCSWA provided supportive resources for patient and caregiver about support groups regarding memory impairment and Public libr[facility] center.     PLAN: 1. Follow up with behavioral health clinician on : Pt was encouraged to contact [facility] if symptoms worsen or fail to improve to schedule behavioral appointments at [facility]. 2. Behavioral recommendations: LCSWA recommends that pt apply healthy coping skills discussed. Pt is encouraged to schedule follow up appointment with LCSWA 3. Referral(s): Community Resources:  Support Groups and [employer] 4. "From scale of 1-10, how likely are you to follow plan?": 7/10  Rebekah [place]field, LCSW  10/24/16 10:35 AM

## 2016-11-09 ENCOUNTER — Ambulatory Visit
Admission: RE | Admit: 2016-11-09 | Discharge: 2016-11-09 | Disposition: A | Discharge status: Home / Self Care | Payer: Medicare Other | Source: Ambulatory Visit | Attending: Internal Medicine | Admitting: Internal Medicine

## 2016-11-09 DIAGNOSIS — Z1231 Encounter for screening mammogram for malignant neoplasm of breast: Secondary | ICD-10-CM | POA: Diagnosis not present

## 2016-11-13 ENCOUNTER — Other Ambulatory Visit: Payer: Self-pay | Admitting: Internal Medicine

## 2016-11-13 DIAGNOSIS — R928 Other abnormal and inconclusive findings on diagnostic imaging of breast: Secondary | ICD-10-CM

## 2016-11-14 ENCOUNTER — Telehealth: Payer: Self-pay

## 2016-11-14 NOTE — Progress Notes (Signed)
Please inform patient of mammogram showing an area of asymmetry (not matching) in the right breast which needs further evaluation which has been scheduled.

## 2016-11-14 NOTE — Telephone Encounter (Signed)
-----   Message from [place], [place] sent at 11/14/2016  4:30 PM EDT ----- Please inform patient of mammogram showing an area of asymmetry (not matching) in the right breast which needs further evaluation which has been scheduled.

## 2016-11-14 NOTE — Telephone Encounter (Signed)
CMA call regarding MM results  Patient Verify DOB  Patient was aware and understood

## 2016-11-17 ENCOUNTER — Ambulatory Visit
Admission: RE | Admit: 2016-11-17 | Discharge: 2016-11-17 | Disposition: A | Discharge status: Home / Self Care | Payer: Medicare Other | Source: Ambulatory Visit | Attending: Internal Medicine | Admitting: Internal Medicine

## 2016-11-17 DIAGNOSIS — R928 Other abnormal and inconclusive findings on diagnostic imaging of breast: Secondary | ICD-10-CM | POA: Diagnosis not present

## 2016-11-20 ENCOUNTER — Telehealth (INDEPENDENT_AMBULATORY_CARE_PROVIDER_SITE_OTHER): Payer: Self-pay | Admitting: *Deleted

## 2016-11-20 NOTE — Telephone Encounter (Signed)
Patient verified DOB Patient is aware of mammogram showing no evidence of malignancy. Patient is advised to have a repeat completed in one year. No further questions.

## 2016-11-20 NOTE — Telephone Encounter (Signed)
-----   Message from Tresa Garter, MD sent at 11/17/2016  6:05 PM EDT ----- Please inform patient that her screening mammogram shows no evidence of malignancy. Recommend screening mammogram in one year

## 2017-02-15 NOTE — Telephone Encounter (Signed)
Mr Cunningham(on DPR) is calling re:the behavior/concern of pt re: her dementia.  He states that increasingly she feels people are stealing from her exa: clothes credit cards and feels that various people owe her money.  Mr Candis Schatz would very much like to know if there is anything that Dr Leta Baptist can do suggest or prescribe.  Please call

## 2017-02-16 NOTE — Telephone Encounter (Signed)
Patients husband called office returning call to schedule a follow up visit next week with NP.  Patient is scheduled for 02/19/17 with Cecille Rubin for behavior changes.  Patient of Dr. Leta Baptist.  FYI

## 2017-02-16 NOTE — Telephone Encounter (Signed)
Called husband and LMVM for him to all back about making appt with NP this coming week to discuss.

## 2017-02-16 NOTE — Telephone Encounter (Signed)
appt made 02-20-1244 with CM/NP.
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
## 2017-02-19 ENCOUNTER — Encounter: Payer: Self-pay | Admitting: Nurse Practitioner

## 2017-02-19 ENCOUNTER — Ambulatory Visit (INDEPENDENT_AMBULATORY_CARE_PROVIDER_SITE_OTHER): Payer: Medicare Other | Admitting: Nurse Practitioner

## 2017-02-19 VITALS — BP 116/72 | HR 77 | Wt 141.6 lb

## 2017-02-19 DIAGNOSIS — F0391 Unspecified dementia with behavioral disturbance: Secondary | ICD-10-CM

## 2017-02-19 DIAGNOSIS — F03B18 Unspecified dementia, moderate, with other behavioral disturbance: Secondary | ICD-10-CM

## 2017-02-19 NOTE — Progress Notes (Signed)
I reviewed note and agree with plan.   Penni Bombard, MD 09/23/2438, 1:02 PM Certified in Neurology, Neurophysiology and Neuroimaging  [facility] [address], [place], [place] [address] [phone]

## 2017-02-19 NOTE — Patient Instructions (Addendum)
Pt reluctant to take memory meds Advise B complex vitamins Return if symptoms worsen MMSE 16/30 DMV has revoked license

## 2017-03-05 ENCOUNTER — Telehealth: Payer: Self-pay | Admitting: Diagnostic Neuroimaging

## 2017-03-05 NOTE — Telephone Encounter (Signed)
Called Mr Candis Schatz and advised him that unless he is patient's POA, a letter would not suffice to allow him to take care of her money and transactions. He could if patient was with him perhaps. Advised he call a family lawyer to discuss his options. He verbalized understanding, appreciation.
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
## 2017-03-16 DIAGNOSIS — Z23 Encounter for immunization: Secondary | ICD-10-CM | POA: Diagnosis not present

## 2017-04-12 ENCOUNTER — Emergency Department (HOSPITAL_COMMUNITY): Payer: Medicare Other

## 2017-04-12 ENCOUNTER — Emergency Department (HOSPITAL_COMMUNITY)
Admission: EM | Admit: 2017-04-12 | Discharge: 2017-04-12 | Disposition: A | Discharge status: Home / Self Care | Payer: Medicare Other | Attending: Emergency Medicine | Admitting: Emergency Medicine

## 2017-04-12 DIAGNOSIS — F0390 Unspecified dementia without behavioral disturbance: Secondary | ICD-10-CM

## 2017-04-12 DIAGNOSIS — Z79899 Other long term (current) drug therapy: Secondary | ICD-10-CM | POA: Insufficient documentation

## 2017-04-12 DIAGNOSIS — R51 Headache: Secondary | ICD-10-CM | POA: Diagnosis not present

## 2017-04-12 DIAGNOSIS — Z7982 Long term (current) use of aspirin: Secondary | ICD-10-CM | POA: Insufficient documentation

## 2017-04-12 DIAGNOSIS — F0391 Unspecified dementia with behavioral disturbance: Secondary | ICD-10-CM | POA: Insufficient documentation

## 2017-04-12 LAB — I-STAT CHEM 8, ED
BUN: 9 mg/dL (ref 6–20)
CALCIUM ION: 1.14 mmol/L — AB (ref 1.15–1.40)
CHLORIDE: 104 mmol/L (ref 101–111)
CREATININE: 0.7 mg/dL (ref 0.44–1.00)
Glucose, Bld: 78 mg/dL (ref 65–99)
HCT: 34 % — ABNORMAL LOW (ref 36.0–46.0)
Hemoglobin: 11.6 g/dL — ABNORMAL LOW (ref 12.0–15.0)
Potassium: 3.3 mmol/L — ABNORMAL LOW (ref 3.5–5.1)
SODIUM: 142 mmol/L (ref 135–145)
TCO2: 26 mmol/L (ref 22–32)

## 2017-04-12 MED ORDER — POTASSIUM CHLORIDE CRYS ER 20 MEQ PO TBCR
40.0000 meq | EXTENDED_RELEASE_TABLET | Freq: Once | ORAL | Status: AC
  Administered 2017-04-12: 40 meq via ORAL
  Filled 2017-04-12: qty 2

## 2017-04-12 NOTE — ED Notes (Signed)
Pt up to bedpain.

## 2017-04-12 NOTE — ED Notes (Signed)
Patient transported to CT 

## 2017-04-12 NOTE — ED Notes (Signed)
PT speaking with [employer] in private at this time
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
## 2017-04-12 NOTE — ED Notes (Signed)
Pt reports no pain and stated she was concerned about her hair/balding. Pt asked if there was vitamins that she could trial here to help with hair growth. This RN stated that that was not an emergency and pt would need to follow up elsewhere for hair growth.
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
## 2017-04-12 NOTE — ED Notes (Signed)
ED Provider at bedside. 

## 2017-04-12 NOTE — ED Notes (Signed)
Pt verbalizes understanding of d/c instructions. Pt ambulatory at d/c with all belongings.   

## 2017-04-23 ENCOUNTER — Encounter (HOSPITAL_COMMUNITY): Payer: Self-pay | Admitting: Nurse Practitioner

## 2017-04-23 ENCOUNTER — Emergency Department (HOSPITAL_COMMUNITY)
Admission: EM | Admit: 2017-04-23 | Discharge: 2017-04-23 | Disposition: A | Discharge status: Home / Self Care | Payer: Medicare Other | Attending: Emergency Medicine | Admitting: Emergency Medicine

## 2017-04-23 ENCOUNTER — Other Ambulatory Visit: Payer: Self-pay

## 2017-04-23 DIAGNOSIS — Y939 Activity, unspecified: Secondary | ICD-10-CM | POA: Diagnosis not present

## 2017-04-23 DIAGNOSIS — F0390 Unspecified dementia without behavioral disturbance: Secondary | ICD-10-CM | POA: Diagnosis not present

## 2017-04-23 DIAGNOSIS — Y929 Unspecified place or not applicable: Secondary | ICD-10-CM | POA: Diagnosis not present

## 2017-04-23 DIAGNOSIS — Y998 Other external cause status: Secondary | ICD-10-CM | POA: Insufficient documentation

## 2017-04-23 DIAGNOSIS — W57XXXA Bitten or stung by nonvenomous insect and other nonvenomous arthropods, initial encounter: Secondary | ICD-10-CM | POA: Insufficient documentation

## 2017-04-23 DIAGNOSIS — S0087XA Other superficial bite of other part of head, initial encounter: Secondary | ICD-10-CM | POA: Diagnosis not present

## 2017-04-23 DIAGNOSIS — Z79899 Other long term (current) drug therapy: Secondary | ICD-10-CM | POA: Insufficient documentation

## 2017-04-23 DIAGNOSIS — L03211 Cellulitis of face: Secondary | ICD-10-CM | POA: Insufficient documentation

## 2017-04-23 DIAGNOSIS — R0981 Nasal congestion: Secondary | ICD-10-CM | POA: Insufficient documentation

## 2017-04-23 DIAGNOSIS — S0086XA Insect bite (nonvenomous) of other part of head, initial encounter: Secondary | ICD-10-CM | POA: Diagnosis present

## 2017-04-23 NOTE — Discharge Instructions (Signed)
Apply antibiotic ointment to the face and use warm compresses several times a day Return if area becomes more painful or you have discharge from the area Follow up with Dr. Doreene Burke Make appointment with Dr. Maurilio Lovely (dentist)
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
## 2017-04-23 NOTE — ED Triage Notes (Signed)
Pt presents for insect bite and URI symptoms. Patient notes bite on face today it is open with no drainage- mild swelling noted. Pt sts it is itchy but not painful. Pt sts her grandkids had a cold and came over and she has clear nasal drainage and hoarse voice and is not feeling her self for the past few days.

## 2017-05-02 ENCOUNTER — Encounter (HOSPITAL_COMMUNITY): Payer: Self-pay | Admitting: Emergency Medicine

## 2017-05-02 ENCOUNTER — Emergency Department (HOSPITAL_COMMUNITY)
Admission: EM | Admit: 2017-05-02 | Discharge: 2017-05-02 | Disposition: A | Discharge status: Home / Self Care | Payer: Medicare Other | Attending: Emergency Medicine | Admitting: Emergency Medicine

## 2017-05-02 DIAGNOSIS — S098XXA Other specified injuries of head, initial encounter: Secondary | ICD-10-CM | POA: Diagnosis not present

## 2017-05-02 DIAGNOSIS — Z79899 Other long term (current) drug therapy: Secondary | ICD-10-CM | POA: Diagnosis not present

## 2017-05-02 DIAGNOSIS — R51 Headache: Secondary | ICD-10-CM | POA: Diagnosis not present

## 2017-05-02 DIAGNOSIS — F0390 Unspecified dementia without behavioral disturbance: Secondary | ICD-10-CM | POA: Insufficient documentation

## 2017-05-02 DIAGNOSIS — R519 Headache, unspecified: Secondary | ICD-10-CM

## 2017-05-02 DIAGNOSIS — G4489 Other headache syndrome: Secondary | ICD-10-CM | POA: Diagnosis not present

## 2017-05-02 LAB — URINALYSIS, ROUTINE W REFLEX MICROSCOPIC
BILIRUBIN URINE: NEGATIVE
Bacteria, UA: NONE SEEN
Glucose, UA: NEGATIVE mg/dL
Hgb urine dipstick: NEGATIVE
KETONES UR: 5 mg/dL — AB
Nitrite: POSITIVE — AB
Protein, ur: NEGATIVE mg/dL
Specific Gravity, Urine: 1.026 (ref 1.005–1.030)
pH: 5 (ref 5.0–8.0)
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
## 2017-05-02 NOTE — ED Notes (Signed)
Family at bedside. 
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
## 2017-05-02 NOTE — ED Notes (Signed)
ED Provider at bedside. 

## 2017-05-02 NOTE — Discharge Instructions (Signed)
Follow-up with your primary care provider about your visit today. You can take Tylenol as needed for headache.

## 2017-05-04 LAB — URINE CULTURE

## 2017-05-16 ENCOUNTER — Ambulatory Visit: Payer: Medicare Other | Attending: Internal Medicine | Admitting: Physician Assistant

## 2017-05-16 ENCOUNTER — Other Ambulatory Visit: Payer: Self-pay

## 2017-05-16 VITALS — BP 115/76 | HR 79 | Temp 98.0°F | Resp 16 | Ht 64.0 in | Wt 142.0 lb

## 2017-05-16 DIAGNOSIS — Z09 Encounter for follow-up examination after completed treatment for conditions other than malignant neoplasm: Secondary | ICD-10-CM

## 2017-05-16 DIAGNOSIS — E876 Hypokalemia: Secondary | ICD-10-CM | POA: Diagnosis not present

## 2017-05-16 DIAGNOSIS — F03B18 Unspecified dementia, moderate, with other behavioral disturbance: Secondary | ICD-10-CM

## 2017-05-16 DIAGNOSIS — F0391 Unspecified dementia with behavioral disturbance: Secondary | ICD-10-CM | POA: Diagnosis not present
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
## 2017-05-17 LAB — BASIC METABOLIC PANEL
BUN/Creatinine Ratio: 17 (ref 12–28)
BUN: 11 mg/dL (ref 8–27)
CALCIUM: 9 mg/dL (ref 8.7–10.3)
CHLORIDE: 103 mmol/L (ref 96–106)
CO2: 28 mmol/L (ref 20–29)
Creatinine, Ser: 0.65 mg/dL (ref 0.57–1.00)
GFR calc Af Amer: 106 mL/min/{1.73_m2} (ref >59)
GFR, EST NON AFRICAN AMERICAN: 92 mL/min/{1.73_m2} (ref >59)
GLUCOSE: 82 mg/dL (ref 65–99)
POTASSIUM: 4.1 mmol/L (ref 3.5–5.2)
SODIUM: 143 mmol/L (ref 134–144)

## 2017-06-20 ENCOUNTER — Ambulatory Visit: Payer: Medicare Other | Attending: Internal Medicine | Admitting: Internal Medicine

## 2017-06-20 ENCOUNTER — Encounter: Payer: Self-pay | Admitting: Internal Medicine

## 2017-06-20 VITALS — BP 101/64 | HR 83 | Temp 97.6°F | Resp 18 | Ht 63.0 in | Wt 138.0 lb

## 2017-06-20 DIAGNOSIS — Z7982 Long term (current) use of aspirin: Secondary | ICD-10-CM | POA: Diagnosis not present

## 2017-06-20 DIAGNOSIS — F03B18 Unspecified dementia, moderate, with other behavioral disturbance: Secondary | ICD-10-CM

## 2017-06-20 DIAGNOSIS — F0391 Unspecified dementia with behavioral disturbance: Secondary | ICD-10-CM | POA: Diagnosis not present

## 2017-06-20 DIAGNOSIS — R6889 Other general symptoms and signs: Secondary | ICD-10-CM

## 2017-06-20 DIAGNOSIS — R5383 Other fatigue: Secondary | ICD-10-CM | POA: Insufficient documentation

## 2017-06-20 DIAGNOSIS — Z79899 Other long term (current) drug therapy: Secondary | ICD-10-CM | POA: Insufficient documentation

## 2017-06-20 DIAGNOSIS — E785 Hyperlipidemia, unspecified: Secondary | ICD-10-CM | POA: Insufficient documentation

## 2017-06-20 NOTE — Patient Instructions (Signed)
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
## 2017-06-21 LAB — LIPID PANEL
CHOL/HDL RATIO: 4.7 ratio — AB (ref 0.0–4.4)
Cholesterol, Total: 271 mg/dL — ABNORMAL HIGH (ref 100–199)
HDL: 58 mg/dL (ref >39)
LDL CALC: 192 mg/dL — AB (ref 0–99)
Triglycerides: 106 mg/dL (ref 0–149)
VLDL Cholesterol Cal: 21 mg/dL (ref 5–40)

## 2017-06-21 LAB — CBC WITH DIFFERENTIAL/PLATELET
BASOS ABS: 0 10*3/uL (ref 0.0–0.2)
BASOS: 1 %
EOS (ABSOLUTE): 0.1 10*3/uL (ref 0.0–0.4)
EOS: 3 %
HEMOGLOBIN: 11.2 g/dL (ref 11.1–15.9)
Hematocrit: 34.9 % (ref 34.0–46.6)
Immature Grans (Abs): 0 10*3/uL (ref 0.0–0.1)
Immature Granulocytes: 0 %
LYMPHS ABS: 1 10*3/uL (ref 0.7–3.1)
LYMPHS: 29 %
MCH: 28.9 pg (ref 26.6–33.0)
MCHC: 32.1 g/dL (ref 31.5–35.7)
MCV: 90 fL (ref 79–97)
MONOCYTES: 9 %
Monocytes Absolute: 0.3 10*3/uL (ref 0.1–0.9)
Neutrophils Absolute: 2.1 10*3/uL (ref 1.4–7.0)
Neutrophils: 58 %
Platelets: 273 10*3/uL (ref 150–379)
RBC: 3.88 x10E6/uL (ref 3.77–5.28)
RDW: 13.8 % (ref 12.3–15.4)
WBC: 3.6 10*3/uL (ref 3.4–10.8)

## 2017-06-21 LAB — TSH: TSH: 0.973 u[IU]/mL (ref 0.450–4.500)

## 2017-06-21 LAB — CMP14+EGFR
ALT: 10 IU/L (ref 0–32)
AST: 16 IU/L (ref 0–40)
Albumin/Globulin Ratio: 1.4 (ref 1.2–2.2)
Albumin: 4.2 g/dL (ref 3.6–4.8)
Alkaline Phosphatase: 40 IU/L (ref 39–117)
BILIRUBIN TOTAL: 0.7 mg/dL (ref 0.0–1.2)
BUN/Creatinine Ratio: 16 (ref 12–28)
BUN: 13 mg/dL (ref 8–27)
CO2: 28 mmol/L (ref 20–29)
CREATININE: 0.8 mg/dL (ref 0.57–1.00)
Calcium: 9.3 mg/dL (ref 8.7–10.3)
Chloride: 105 mmol/L (ref 96–106)
GFR calc non Af Amer: 77 mL/min/{1.73_m2} (ref >59)
GFR, EST AFRICAN AMERICAN: 88 mL/min/{1.73_m2} (ref >59)
GLOBULIN, TOTAL: 2.9 g/dL (ref 1.5–4.5)
GLUCOSE: 69 mg/dL (ref 65–99)
Potassium: 3.7 mmol/L (ref 3.5–5.2)
SODIUM: 144 mmol/L (ref 134–144)
TOTAL PROTEIN: 7.1 g/dL (ref 6.0–8.5)

## 2017-07-09 ENCOUNTER — Telehealth (INDEPENDENT_AMBULATORY_CARE_PROVIDER_SITE_OTHER): Payer: Self-pay | Admitting: *Deleted

## 2017-07-09 NOTE — Telephone Encounter (Signed)
-----   Message from Tresa Garter, MD sent at 06/22/2017 12:40 PM EST ----- Results are normal except for high cholesterol level. To address this please limit saturated fat to no more than 7% of your calories, limit cholesterol to 200 mg/day, increase fiber and exercise as tolerated. If needed we may add another cholesterol lowering medication to your regimen.

## 2017-07-09 NOTE — Telephone Encounter (Signed)
MA unable to reach patient due to phone ringing and disconnecting. !!Please inform patient of cholesterol level being high and to increase fiber and exercise. Patient may have to start another lowering medication if improvement is not noted on the next recheck!!

## 2017-07-17 ENCOUNTER — Emergency Department (HOSPITAL_COMMUNITY)
Admission: EM | Admit: 2017-07-17 | Discharge: 2017-07-18 | Disposition: A | Discharge status: Home / Self Care | Payer: Medicare Other | Attending: Emergency Medicine | Admitting: Emergency Medicine

## 2017-07-17 ENCOUNTER — Encounter (HOSPITAL_COMMUNITY): Payer: Self-pay

## 2017-07-17 ENCOUNTER — Other Ambulatory Visit: Payer: Self-pay

## 2017-07-17 DIAGNOSIS — Z9183 Wandering in diseases classified elsewhere: Secondary | ICD-10-CM | POA: Diagnosis not present

## 2017-07-17 DIAGNOSIS — F4310 Post-traumatic stress disorder, unspecified: Secondary | ICD-10-CM | POA: Diagnosis not present

## 2017-07-17 DIAGNOSIS — F0391 Unspecified dementia with behavioral disturbance: Secondary | ICD-10-CM | POA: Diagnosis not present

## 2017-07-17 DIAGNOSIS — G4489 Other headache syndrome: Secondary | ICD-10-CM | POA: Diagnosis not present

## 2017-07-17 DIAGNOSIS — S0990XA Unspecified injury of head, initial encounter: Secondary | ICD-10-CM | POA: Diagnosis not present

## 2017-07-17 LAB — CBC
HCT: 35.9 % — ABNORMAL LOW (ref 36.0–46.0)
Hemoglobin: 11.6 g/dL — ABNORMAL LOW (ref 12.0–15.0)
MCH: 29.4 pg (ref 26.0–34.0)
MCHC: 32.3 g/dL (ref 30.0–36.0)
MCV: 90.9 fL (ref 78.0–100.0)
PLATELETS: 281 10*3/uL (ref 150–400)
RBC: 3.95 MIL/uL (ref 3.87–5.11)
RDW: 12.9 % (ref 11.5–15.5)
WBC: 4.6 10*3/uL (ref 4.0–10.5)

## 2017-07-17 LAB — CBG MONITORING, ED
GLUCOSE-CAPILLARY: 62 mg/dL — AB (ref 65–99)
Glucose-Capillary: 83 mg/dL (ref 65–99)

## 2017-07-17 LAB — COMPREHENSIVE METABOLIC PANEL
ALK PHOS: 41 U/L (ref 38–126)
ALT: 11 U/L — ABNORMAL LOW (ref 14–54)
AST: 17 U/L (ref 15–41)
Albumin: 4.1 g/dL (ref 3.5–5.0)
Anion gap: 8 (ref 5–15)
BILIRUBIN TOTAL: 0.5 mg/dL (ref 0.3–1.2)
BUN: 17 mg/dL (ref 6–20)
CALCIUM: 9.3 mg/dL (ref 8.9–10.3)
CO2: 26 mmol/L (ref 22–32)
Chloride: 107 mmol/L (ref 101–111)
Creatinine, Ser: 0.58 mg/dL (ref 0.44–1.00)
GFR calc Af Amer: >60 mL/min (ref >60)
GLUCOSE: 82 mg/dL (ref 65–99)
Potassium: 3.9 mmol/L (ref 3.5–5.1)
Sodium: 141 mmol/L (ref 135–145)
TOTAL PROTEIN: 7.7 g/dL (ref 6.5–8.1)
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
## 2017-07-17 NOTE — ED Notes (Signed)
Pt given and reviewed dc vitals. Pt refused dc vital signs. Pt's daughter verbalized understanding of dc teaching

## 2017-07-17 NOTE — ED Notes (Signed)
CBG 62 mg/dl. Given apple juice, orange juice, crackers and peanut butter. Will recheck CBG in 30 minutes.

## 2017-07-17 NOTE — ED Notes (Signed)
Pt reports it is "1954" after she was asked what year it is. Unsure of month saying, "I think it's December but you know a lot has been going on. My son thinks he's my father and he's just not going to tell me what to do all the time. I'm doing stuff around the house and he doesn't do anything.I think my sugar is low because I am cold and I didn't eat dinner. I ate a bologna sandwich and then this sandwich they gave me when I got here." Has empty sandwich container and juice already at bedside. Pt ambulatory with steady gait. Alert but confused.
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
## 2017-07-23 ENCOUNTER — Telehealth: Payer: Self-pay | Admitting: Diagnostic Neuroimaging

## 2017-07-23 ENCOUNTER — Telehealth: Payer: Self-pay | Admitting: Internal Medicine

## 2017-07-23 NOTE — Telephone Encounter (Signed)
Rhonda Peterson pt. Daughter came to facility requesting an FL2 form to be filled out. Pt. Had been recently diagnosed with Dementia. Please f/u  Phone: [phone]

## 2017-07-23 NOTE — Telephone Encounter (Signed)
Patient's husband is calling to discuss having a FL-2 form filled out. The patient has been wondering off from home and needs to be in a memory care facility.

## 2017-07-23 NOTE — Telephone Encounter (Signed)
Please contact pts husband at [phone]

## 2017-07-23 NOTE — Telephone Encounter (Signed)
Spoke with Gypsy Decant, on DPR and advised him that the patient was last seen in this office 6 months ago by NP and was released, return as needed. She saw Dr Leta Baptist a year ago. She has seen her PCP twice since Sept 2018, so this RN advised he call her PCP to do FL2 papers for her. He verbalized understanding, appreciation of call.

## 2017-07-25 ENCOUNTER — Ambulatory Visit: Payer: Medicare Other | Attending: Internal Medicine | Admitting: Internal Medicine

## 2017-07-25 ENCOUNTER — Ambulatory Visit: Payer: Medicare Other | Admitting: Licensed Clinical Social Worker

## 2017-07-25 ENCOUNTER — Encounter: Payer: Self-pay | Admitting: Internal Medicine

## 2017-07-25 VITALS — BP 115/74 | HR 90 | Resp 16 | Wt 131.4 lb

## 2017-07-25 DIAGNOSIS — F0391 Unspecified dementia with behavioral disturbance: Secondary | ICD-10-CM | POA: Insufficient documentation

## 2017-07-25 DIAGNOSIS — F03B18 Unspecified dementia, moderate, with other behavioral disturbance: Secondary | ICD-10-CM

## 2017-07-25 DIAGNOSIS — F4310 Post-traumatic stress disorder, unspecified: Secondary | ICD-10-CM | POA: Insufficient documentation

## 2017-07-25 DIAGNOSIS — F329 Major depressive disorder, single episode, unspecified: Secondary | ICD-10-CM | POA: Diagnosis not present

## 2017-07-25 DIAGNOSIS — E785 Hyperlipidemia, unspecified: Secondary | ICD-10-CM | POA: Diagnosis not present

## 2017-07-25 DIAGNOSIS — Z9114 Patient's other noncompliance with medication regimen: Secondary | ICD-10-CM | POA: Diagnosis not present

## 2017-07-25 DIAGNOSIS — Z79899 Other long term (current) drug therapy: Secondary | ICD-10-CM | POA: Insufficient documentation

## 2017-07-25 DIAGNOSIS — F0390 Unspecified dementia without behavioral disturbance: Secondary | ICD-10-CM | POA: Diagnosis present

## 2017-07-25 NOTE — BH Specialist Note (Signed)
Integrated Behavioral Health Initial Visit  MRN: [id number] Name: Rhonda Peterson  Number of [facility] Clinician visits:: 1/6 Session Start time: 3:40 PM  Session End time: 4:00 PM Total time: 20 minutes  Type of Service: [place] Interpretor:No. Interpretor Name and Language: N/A   Warm Hand Off Completed.       SUBJECTIVE: Rhonda Peterson is a 66 y.o. female accompanied by Spouse Patient was referred by Dr. Doreene Burke for dementia, depression, and anxiety. Patient reports the following symptoms/concerns: feelings of sadness and worry, low energy, decreased concentration, and irritability Duration of problem: Ongoing; Severity of problem: moderate  OBJECTIVE: Mood: Pleasant and Affect: Appropriate Risk of harm to self or others: No plan to harm self or others  LIFE CONTEXT: Family and Social: Pt receives support from family. She was accompanied by spouse during visit. 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 Reduce symptoms of: agitation and stress 2. Increase knowledge and/or ability of: coping skills and healthy habits  3. Demonstrate ability to: Increase healthy adjustment to current life circumstances, Increase adequate support systems for patient/family and Improve medication compliance  INTERVENTIONS: Interventions utilized: Solution-Focused Strategies, Supportive Counseling, Psychoeducation and/or Health Education and Link to [employer]  Standardized Assessments completed: GAD-7 and PHQ 2&9  ASSESSMENT: Patient currently experiencing depression and anxiety triggered by memory impairment and residing with adult children and grandchildren. Pt's spouse was present during visit and  provided majority of history. He states pt's memory is declining and she is non-compliant with taking medications. Family requesting information on placement options.   LCSWA informed family that pt's insurance will not cover 100 % of cost should pt be placed in a memory care facility. Pt was encouraged to apply for Medicaid to assist with coverage. Family was provided information regarding [facility] program which will provide additional in-home support services. Family was appreciative for information. LCSWA strongly encouraged family to contact her with any additional questions or concerns that may arise.   PLAN: 1. Follow up with behavioral health clinician on : Pt was encouraged to contact LCSWA if symptoms worsen or fail to improve to schedule behavioral appointments at [facility]. 2. Behavioral recommendations: LCSWA recommends that family utilize provided resources and contact PCP or LCSWA with any additional questions or concerns.  3. Referral(s): [facility] 4. "From scale of 1-10, how likely are you to follow plan?": 8/10  Rebekah Chesterfield, LCSW 07/27/17 3:51 PM
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
## 2017-07-25 NOTE — Patient Instructions (Signed)
Dementia Dementia is the loss of two or more brain functions, such as:  Memory.  Decision making.  Behavior.  Speaking.  Thinking.  Problem solving.  There are many types of dementia. The most common type is called progressive dementia. Progressive dementia gets worse with time and it is irreversible. An example of this type of dementia is Alzheimer disease. What are the causes? This condition may be caused by:  Nerve cell damage in the brain.  Genetic mutations.  Certain medicines.  Multiple small strokes.  An infection, such as chronic meningitis.  A metabolic problem, such as vitamin B12 deficiency or thyroid disease.  Pressure on the brain, such as from a tumor or blood clot.  What are the signs or symptoms? Symptoms of this condition include:  Sudden changes in mood.  Depression.  Problems with balance.  Changes in personality.  Poor short-term memory.  Agitation.  Delusions.  Hallucinations.  Having a hard time: ? Speaking thoughts. ? Finding words. ? Solving problems. ? Doing familiar tasks. ? Understanding familiar ideas.  How is this diagnosed? This condition is diagnosed with an assessment by your health care provider. During this assessment, your health care provider will talk with you and your family, friends, or caregivers about your symptoms. A thorough medical history will be taken, and you will have a physical exam and tests. Tests may include:  Lab tests, such as blood or urine tests.  Imaging tests, such as a CT scan, PET scan, or MRI.  A lumbar puncture. This test involves removing and testing a small amount of the fluid that surrounds the brain and spinal cord.  An electroencephalogram (EEG). In this test, small metal discs are used to measure electrical activity in the brain.  Memory tests, cognitive tests, and neuropsychological tests. These tests evaluate brain function.  How is this treated? Treatment depends on the  cause of the dementia. It may involve taking medicines that may help:  To control the dementia.  To slow down the disease.  To manage symptoms.  In some cases, treating the cause of the dementia can improve symptoms, reverse symptoms, or slow down how quickly the dementia gets worse. Your health care provider can help direct you to support groups, organizations, and other health care providers who can help with decisions about your care. Follow these instructions at home: Medicine  Take over-the-counter and prescription medicines only as told by your health care provider.  Avoid taking medicines that can affect thinking, such as pain or sleeping medicines. Lifestyle   Make healthy lifestyle choices: ? Be physically active as told by your health care provider. ? Do not use any tobacco products, such as cigarettes, chewing tobacco, and e-cigarettes. If you need help quitting, ask your health care provider. ? Eat a healthy diet. ? Practice stress-management techniques when you get stressed. ? Stay social.  Drink enough fluid to keep your urine clear or pale yellow.  Make sure to get quality sleep. These tips can help you to get a good night's rest: ? Avoid napping during the day. ? Keep your sleeping area dark and cool. ? Avoid exercising during the few hours before you go to bed. ? Avoid caffeine products in the evening. General instructions  Work with your health care provider to determine what you need help with and what your safety needs are.  If you were given a bracelet that tracks your location, make sure to wear it.  Keep all follow-up visits as told by your   health care provider. This is important. Contact a health care provider if:  You have any new symptoms.  You have problems with choking or swallowing.  You have any symptoms of a different illness. Get help right away if:  You develop a fever.  You have new or worsening confusion.  You have new or  worsening sleepiness.  You have a hard time staying awake.  You or your family members become concerned for your safety. This information is not intended to replace advice given to you by your health care provider. Make sure you discuss any questions you have with your health care provider. Document Released: 11/22/2000 Document Revised: 10/07/2015 Document Reviewed: 02/24/2015 Elsevier Interactive Patient Education  2018 [employer].   Dementia Caregiver Guide Dementia is a term used to describe a number of symptoms that affect memory and thinking. The most common symptoms include:  Memory loss.  Trouble with language and communication.  Trouble concentrating.  Poor judgment.  Problems with reasoning.  Child-like behavior and language.  Extreme anxiety.  Angry outbursts.  Wandering from home or public places.  Dementia usually gets worse slowly over time. In the early stages, people with dementia can stay independent and safe with some help. In later stages, they need help with daily tasks such as dressing, grooming, and using the bathroom. How to help the person with dementia cope Dementia can be frightening and confusing. Here are some tips to help the person with dementia cope with changes caused by the disease. General tips  Keep the person on track with his or her routine.  Try to identify areas where the person may need help.  Be supportive, patient, calm, and encouraging.  Gently remind the person that adjusting to changes takes time.  Help with the tasks that the person has asked for help with.  Keep the person involved in daily tasks and decisions as much as possible.  Encourage conversation, but try not to get frustrated or harried if the person struggles to find words or does not seem to appreciate your help. Communication tips  When the person is talking or seems frustrated, make eye contact and hold the person's hand.  Ask specific questions that  need yes or no answers.  Use simple words, short sentences, and a calm voice. Only give one direction at a time.  When offering choices, limit them to just 1 or 2.  Avoid correcting the person in a negative way.  If the person is struggling to find the right words, gently try to help him or her. How to recognize symptoms of stress Symptoms of stress in caregivers include:  Feeling frustrated or angry with the person with dementia.  Denying that the person has dementia or that his or her symptoms will not improve.  Feeling hopeless and unappreciated.  Difficulty sleeping.  Difficulty concentrating.  Feeling anxious, irritable, or depressed.  Developing stress-related health problems.  Feeling like you have too little time for your own life.  Follow these instructions at home:  Make sure that you and the person you are caring for: ? Get regular sleep. ? Exercise regularly. ? Eat regular, nutritious meals. ? Drink enough fluid to keep your urine clear or pale yellow. ? Take over-the-counter and prescription medicines only as told by your health care providers. ? Attend all scheduled health care appointments.  Join a support group with others who are caregivers.  Ask about respite care resources so that you can have a regular break from the  stress of caregiving.  Look for signs of stress in yourself and in the person you are caring for. If you notice signs of stress, take steps to manage it.  Consider any safety risks and take steps to avoid them.  Organize medications in a pill box for each day of the week.  Create a plan to handle any legal or financial matters. Get legal or financial advice if needed.  Keep a calendar in a central location to remind the person of appointments or other activities. Tips for reducing the risk of injury  Keep floors clear of clutter. Remove rugs, magazine racks, and floor lamps.  Keep hallways well lit, especially at night.  Put a  handrail and nonslip mat in the bathtub or shower.  Put childproof locks on cabinets that contain dangerous items, such as medicines, alcohol, guns, toxic cleaning items, sharp tools or utensils, matches, and lighters.  Put the locks in places where the person cannot see or reach them easily. This will help ensure that the person does not wander out of the house and get lost.  Be prepared for emergencies. Keep a list of emergency phone numbers and addresses in a convenient area.  Remove car keys and lock garage doors so that the person does not try to get in the car and drive.  Have the person wear a bracelet that tracks locations and identifies the person as having memory problems. This should be worn at all times for safety. Where to find support: Many individuals and organizations offer support. These include:  Support groups for people with dementia and for caregivers.  Counselors or therapists.  Home health care services.  Adult day care centers.  Where to find more information: Alzheimer's Association: [employer] Contact a health care provider if:  The person's health is rapidly getting worse.  You are no longer able to care for the person.  Caring for the person is affecting your physical and emotional health.  The person threatens himself or herself, you, or anyone else. Summary  Dementia is a term used to describe a number of symptoms that affect memory and thinking.  Dementia usually gets worse slowly over time.  Take steps to reduce the person's risk of injury, and to plan for future care.  Caregivers need support, relief from caregiving, and time for their own lives. This information is not intended to replace advice given to you by your health care provider. Make sure you discuss any questions you have with your health care provider. Document Released: 05/02/2016 Document Revised: 05/02/2016 Document Reviewed: 05/02/2016 Elsevier Interactive Patient Education   [employer].

## 2017-07-25 NOTE — Telephone Encounter (Signed)
Please assist with this if possible. I do not have forms with me here at [facility].

## 2017-10-08 ENCOUNTER — Other Ambulatory Visit: Payer: Self-pay | Admitting: Internal Medicine

## 2017-10-08 DIAGNOSIS — Z1231 Encounter for screening mammogram for malignant neoplasm of breast: Secondary | ICD-10-CM

## 2017-10-23 ENCOUNTER — Ambulatory Visit: Payer: Medicare Other | Attending: Family Medicine | Admitting: Family Medicine

## 2017-10-23 ENCOUNTER — Encounter: Payer: Self-pay | Admitting: Family Medicine

## 2017-10-23 VITALS — BP 166/71 | HR 82 | Temp 98.1°F | Ht 63.0 in | Wt 132.6 lb

## 2017-10-23 DIAGNOSIS — F03B18 Unspecified dementia, moderate, with other behavioral disturbance: Secondary | ICD-10-CM

## 2017-10-23 DIAGNOSIS — F0391 Unspecified dementia with behavioral disturbance: Secondary | ICD-10-CM | POA: Diagnosis not present

## 2017-10-23 DIAGNOSIS — Z79899 Other long term (current) drug therapy: Secondary | ICD-10-CM | POA: Diagnosis not present

## 2017-10-23 DIAGNOSIS — F329 Major depressive disorder, single episode, unspecified: Secondary | ICD-10-CM | POA: Diagnosis not present

## 2017-10-23 DIAGNOSIS — R030 Elevated blood-pressure reading, without diagnosis of hypertension: Secondary | ICD-10-CM

## 2017-10-23 DIAGNOSIS — Z9119 Patient's noncompliance with other medical treatment and regimen: Secondary | ICD-10-CM | POA: Diagnosis not present

## 2017-10-23 DIAGNOSIS — E785 Hyperlipidemia, unspecified: Secondary | ICD-10-CM | POA: Diagnosis not present

## 2017-10-23 MED ORDER — SIMVASTATIN 40 MG PO TABS: 40.0000 mg | ORAL_TABLET | Freq: Every day | ORAL | 6 refills | Status: DC

## 2017-10-23 MED ORDER — DONEPEZIL HCL 10 MG PO TABS
10.0000 mg | ORAL_TABLET | Freq: Every day | ORAL | 6 refills | Status: DC
Start: 2017-10-23 — End: 2018-01-29

## 2017-10-23 NOTE — Patient Instructions (Signed)
Dementia Dementia is the loss of two or more brain functions, such as:  Memory.  Decision making.  Behavior.  Speaking.  Thinking.  Problem solving.  There are many types of dementia. The most common type is called progressive dementia. Progressive dementia gets worse with time and it is irreversible. An example of this type of dementia is Alzheimer disease. What are the causes? This condition may be caused by:  Nerve cell damage in the brain.  Genetic mutations.  Certain medicines.  Multiple small strokes.  An infection, such as chronic meningitis.  A metabolic problem, such as vitamin B12 deficiency or thyroid disease.  Pressure on the brain, such as from a tumor or blood clot.  What are the signs or symptoms? Symptoms of this condition include:  Sudden changes in mood.  Depression.  Problems with balance.  Changes in personality.  Poor short-term memory.  Agitation.  Delusions.  Hallucinations.  Having a hard time: ? Speaking thoughts. ? Finding words. ? Solving problems. ? Doing familiar tasks. ? Understanding familiar ideas.  How is this diagnosed? This condition is diagnosed with an assessment by your health care provider. During this assessment, your health care provider will talk with you and your family, friends, or caregivers about your symptoms. A thorough medical history will be taken, and you will have a physical exam and tests. Tests may include:  Lab tests, such as blood or urine tests.  Imaging tests, such as a CT scan, PET scan, or MRI.  A lumbar puncture. This test involves removing and testing a small amount of the fluid that surrounds the brain and spinal cord.  An electroencephalogram (EEG). In this test, small metal discs are used to measure electrical activity in the brain.  Memory tests, cognitive tests, and neuropsychological tests. These tests evaluate brain function.  How is this treated? Treatment depends on the  cause of the dementia. It may involve taking medicines that may help:  To control the dementia.  To slow down the disease.  To manage symptoms.  In some cases, treating the cause of the dementia can improve symptoms, reverse symptoms, or slow down how quickly the dementia gets worse. Your health care provider can help direct you to support groups, organizations, and other health care providers who can help with decisions about your care. Follow these instructions at home: Medicine  Take over-the-counter and prescription medicines only as told by your health care provider.  Avoid taking medicines that can affect thinking, such as pain or sleeping medicines. Lifestyle   Make healthy lifestyle choices: ? Be physically active as told by your health care provider. ? Do not use any tobacco products, such as cigarettes, chewing tobacco, and e-cigarettes. If you need help quitting, ask your health care provider. ? Eat a healthy diet. ? Practice stress-management techniques when you get stressed. ? Stay social.  Drink enough fluid to keep your urine clear or pale yellow.  Make sure to get quality sleep. These tips can help you to get a good night's rest: ? Avoid napping during the day. ? Keep your sleeping area dark and cool. ? Avoid exercising during the few hours before you go to bed. ? Avoid caffeine products in the evening. General instructions  Work with your health care provider to determine what you need help with and what your safety needs are.  If you were given a bracelet that tracks your location, make sure to wear it.  Keep all follow-up visits as told by your   health care provider. This is important. Contact a health care provider if:  You have any new symptoms.  You have problems with choking or swallowing.  You have any symptoms of a different illness. Get help right away if:  You develop a fever.  You have new or worsening confusion.  You have new or  worsening sleepiness.  You have a hard time staying awake.  You or your family members become concerned for your safety. This information is not intended to replace advice given to you by your health care provider. Make sure you discuss any questions you have with your health care provider. Document Released: 11/22/2000 Document Revised: 10/07/2015 Document Reviewed: 02/24/2015 Elsevier Interactive Patient Education  2018 [employer]  
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
## 2017-11-12 ENCOUNTER — Ambulatory Visit
Admission: RE | Admit: 2017-11-12 | Discharge: 2017-11-12 | Disposition: A | Discharge status: Home / Self Care | Payer: Medicare Other | Source: Ambulatory Visit | Attending: Internal Medicine | Admitting: Internal Medicine

## 2017-11-12 DIAGNOSIS — Z1231 Encounter for screening mammogram for malignant neoplasm of breast: Secondary | ICD-10-CM

## 2017-11-13 ENCOUNTER — Telehealth: Payer: Self-pay | Admitting: Diagnostic Neuroimaging
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
## 2017-12-12 ENCOUNTER — Encounter (HOSPITAL_COMMUNITY): Payer: Self-pay

## 2017-12-12 ENCOUNTER — Emergency Department (HOSPITAL_COMMUNITY)
Admission: EM | Admit: 2017-12-12 | Discharge: 2017-12-14 | Disposition: A | Discharge status: Other Acute Inpatient Hospital | Payer: Medicare Other | Attending: Emergency Medicine | Admitting: Emergency Medicine

## 2017-12-12 ENCOUNTER — Other Ambulatory Visit: Payer: Self-pay

## 2017-12-12 DIAGNOSIS — F0391 Unspecified dementia with behavioral disturbance: Secondary | ICD-10-CM | POA: Insufficient documentation

## 2017-12-12 DIAGNOSIS — Z79899 Other long term (current) drug therapy: Secondary | ICD-10-CM | POA: Diagnosis not present

## 2017-12-12 DIAGNOSIS — N3000 Acute cystitis without hematuria: Secondary | ICD-10-CM | POA: Diagnosis not present

## 2017-12-12 DIAGNOSIS — R55 Syncope and collapse: Secondary | ICD-10-CM | POA: Diagnosis not present

## 2017-12-12 DIAGNOSIS — R410 Disorientation, unspecified: Secondary | ICD-10-CM | POA: Diagnosis present

## 2017-12-12 DIAGNOSIS — I959 Hypotension, unspecified: Secondary | ICD-10-CM | POA: Diagnosis not present

## 2017-12-12 DIAGNOSIS — F333 Major depressive disorder, recurrent, severe with psychotic symptoms: Secondary | ICD-10-CM | POA: Diagnosis not present

## 2017-12-12 DIAGNOSIS — R52 Pain, unspecified: Secondary | ICD-10-CM | POA: Diagnosis not present

## 2017-12-12 DIAGNOSIS — M542 Cervicalgia: Secondary | ICD-10-CM | POA: Diagnosis not present

## 2017-12-12 LAB — CBC WITH DIFFERENTIAL/PLATELET
Abs Immature Granulocytes: 0 10*3/uL (ref 0.0–0.1)
BASOS ABS: 0 10*3/uL (ref 0.0–0.1)
BASOS PCT: 1 %
EOS ABS: 0.1 10*3/uL (ref 0.0–0.7)
EOS PCT: 2 %
HCT: 38.7 % (ref 36.0–46.0)
HEMOGLOBIN: 12 g/dL (ref 12.0–15.0)
Immature Granulocytes: 0 %
Lymphocytes Relative: 33 %
Lymphs Abs: 1.4 10*3/uL (ref 0.7–4.0)
MCH: 29.3 pg (ref 26.0–34.0)
MCHC: 31 g/dL (ref 30.0–36.0)
MCV: 94.4 fL (ref 78.0–100.0)
MONO ABS: 0.4 10*3/uL (ref 0.1–1.0)
Monocytes Relative: 9 %
Neutro Abs: 2.4 10*3/uL (ref 1.7–7.7)
Neutrophils Relative %: 55 %
PLATELETS: 258 10*3/uL (ref 150–400)
RBC: 4.1 MIL/uL (ref 3.87–5.11)
RDW: 12.1 % (ref 11.5–15.5)
WBC: 4.4 10*3/uL (ref 4.0–10.5)

## 2017-12-12 LAB — RAPID URINE DRUG SCREEN, HOSP PERFORMED
AMPHETAMINES: NOT DETECTED
Benzodiazepines: NOT DETECTED
Cocaine: NOT DETECTED
Opiates: NOT DETECTED
TETRAHYDROCANNABINOL: NOT DETECTED

## 2017-12-12 LAB — URINALYSIS, COMPLETE (UACMP) WITH MICROSCOPIC
BILIRUBIN URINE: NEGATIVE
Glucose, UA: NEGATIVE mg/dL
Hgb urine dipstick: NEGATIVE
Ketones, ur: 5 mg/dL — AB
Leukocytes, UA: NEGATIVE
Nitrite: POSITIVE — AB
PH: 7 (ref 5.0–8.0)
PROTEIN: NEGATIVE mg/dL
SPECIFIC GRAVITY, URINE: 1.024 (ref 1.005–1.030)

## 2017-12-12 LAB — COMPREHENSIVE METABOLIC PANEL
ALBUMIN: 4.3 g/dL (ref 3.5–5.0)
ALK PHOS: 34 U/L — AB (ref 38–126)
ALT: 13 U/L (ref 0–44)
AST: 17 U/L (ref 15–41)
Anion gap: 13 (ref 5–15)
BUN: 11 mg/dL (ref 8–23)
CALCIUM: 9.5 mg/dL (ref 8.9–10.3)
CO2: 27 mmol/L (ref 22–32)
CREATININE: 0.75 mg/dL (ref 0.44–1.00)
Chloride: 104 mmol/L (ref 98–111)
GFR calc Af Amer: >60 mL/min (ref >60)
GFR calc non Af Amer: >60 mL/min (ref >60)
GLUCOSE: 78 mg/dL (ref 70–99)
Potassium: 3.7 mmol/L (ref 3.5–5.1)
Sodium: 144 mmol/L (ref 135–145)
TOTAL PROTEIN: 7.4 g/dL (ref 6.5–8.1)
Total Bilirubin: 1.1 mg/dL (ref 0.3–1.2)

## 2017-12-12 LAB — PATHOLOGIST SMEAR REVIEW

## 2017-12-12 LAB — SALICYLATE LEVEL: Salicylate Lvl: <7 mg/dL (ref 2.8–30.0)

## 2017-12-12 LAB — ETHANOL

## 2017-12-12 LAB — ACETAMINOPHEN LEVEL: Acetaminophen (Tylenol), Serum: <10 ug/mL — ABNORMAL LOW (ref 10–30)

## 2017-12-12 MED ORDER — CEPHALEXIN 250 MG PO CAPS
500.0000 mg | ORAL_CAPSULE | Freq: Two times a day (BID) | ORAL | Status: DC
  Administered 2017-12-12 – 2017-12-14 (×3): 500 mg via ORAL
  Filled 2017-12-12 (×4): qty 2

## 2017-12-12 NOTE — ED Notes (Signed)
Meal tray ordered 

## 2017-12-12 NOTE — ED Notes (Signed)
Pt asking why she is here.  She states her father brought her here because her parents want to go out of town without her.

## 2017-12-12 NOTE — ED Notes (Signed)
Pt given sandwich bag and drink. Pt sitting up in bed.

## 2017-12-12 NOTE — ED Notes (Signed)
Patient denies pain and is resting comfortably.  
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
## 2017-12-13 MED ORDER — LORAZEPAM 0.5 MG PO TABS: 0.5000 mg | ORAL_TABLET | Freq: Four times a day (QID) | ORAL | Status: DC | PRN

## 2017-12-13 NOTE — ED Provider Notes (Signed)
I was asked to see patient who is here for psychiatric evaluation for new delusional behavior.  The nurses call me because she is becoming increasingly delusional and is redirected.  I find the patient in the room she is adamant that she was dropped off here to ask for directions and that she had picked the tiles up out of her suitcase and now cannot find her suitcase.  Patient denies any medical complaints and she feels that she is being persecuted by others and they have injured her somehow.  She cannot tell me where she is her and what city she is in.  When asked specific questions she will confabulate some answers but does not appear to have any insight and wants going on.  She does not appear toxic.  I placed her on an IVC so he can continue to hold her until further psychiatric evaluation.   Hayden Rasmussen, MD 12/14/17 [phone]

## 2017-12-13 NOTE — Progress Notes (Signed)
CSW resent inpatient referrals as several facilities reported they did not receive the ones sent this morning.  CSW will continue to assist with placement needs.   Audree Camel, LCSW, [place] Disposition [place] [facility] BHH/TTS [phone]

## 2017-12-13 NOTE — ED Notes (Signed)
Dr Butler at bedside.  

## 2017-12-13 NOTE — Progress Notes (Signed)
Pt. meets criteria for inpatient treatment per Lindon Romp, NP.  Referred out to the following hospitals: [facility]  [facility]  [facility]        Disposition CSW will continue to follow for placement.  Areatha Keas. Judi Cong, MSW, [place] Disposition Clinical Social Work [phone] (cell) [phone] (office)

## 2017-12-13 NOTE — ED Notes (Signed)
Pt request coffee. Same given.

## 2017-12-13 NOTE — BHH Counselor (Signed)
Reassessment-Pt denies SI/HI.  Pt is delusional and does not appear to be oriented.  Pt continues to meet inpatient criteria.   Lorenza Cambridge, [facility] Triage Specialist

## 2017-12-13 NOTE — Progress Notes (Addendum)
Pt accepted to [facility] [address].  Dr. Bjorn Loser is the accepting/attending provider.   Call report to [phone].   Brie @ [facility] notified.    Pt is IVC and will need to be transported by Event organiser.   Pt may arrive to [facility] no earlier than 10am on 12/14/17.   Per Brie, the magistrate has signed pt's IVC paperwork but law enforcement has not served the papers to pt. Completed IVC paperwork will need to be faxed to admissions at [facility] at [phone] as soon as possible to be reviewed.     Audree Camel, LCSW, [place] Disposition [place] [facility] BHH/TTS [phone]

## 2017-12-13 NOTE — ED Notes (Signed)
TTS at bedsdie

## 2017-12-13 NOTE — ED Notes (Signed)
Pt states her Mother is dead but she saw her a couple of weeks ago. Pt states she is dead at the hospital "but they are still working on her"

## 2017-12-13 NOTE — ED Notes (Signed)
IVC paperwork again filled out by Dr. Melina Copa and faxed to magistrate.

## 2017-12-13 NOTE — ED Notes (Signed)
Pt ambulating in hallway but easily redirects to room. Shower offered but pt states "not now"

## 2017-12-13 NOTE — ED Notes (Signed)
Breakfast tray ordered 

## 2017-12-13 NOTE — ED Notes (Signed)
ED tech at bedsdie as sitter. Family vidsiting pt and pt calm and cooperative. Smiling and talks easily with family

## 2017-12-13 NOTE — ED Notes (Addendum)
RN informed by [facility] Sarah that [facility] possibly may have bed placement for Pt

## 2017-12-13 NOTE — ED Notes (Signed)
Pt oob to bathroom for shower. Steady gait

## 2017-12-13 NOTE — ED Notes (Signed)
Pt out of shower and having difficulty getting clothes on. Pt states the new scrubs have to be returned to the store because they nare not a matched set. Pt says her husband is returning the last set to store. Pt is becoming more agitated and states she is leaving. Security called and MD notified.

## 2017-12-13 NOTE — ED Notes (Addendum)
Pt states that she knows she does not have UTI because she has 2 nurses that are "full Nurses  and they have taught me how to check myself and I don't need this." Medication refused. Will inform provider

## 2017-12-14 DIAGNOSIS — Z87440 Personal history of urinary (tract) infections: Secondary | ICD-10-CM | POA: Diagnosis not present

## 2017-12-14 DIAGNOSIS — N3000 Acute cystitis without hematuria: Secondary | ICD-10-CM | POA: Diagnosis not present

## 2017-12-14 DIAGNOSIS — F413 Other mixed anxiety disorders: Secondary | ICD-10-CM | POA: Diagnosis not present

## 2017-12-14 DIAGNOSIS — L2084 Intrinsic (allergic) eczema: Secondary | ICD-10-CM | POA: Diagnosis not present

## 2017-12-14 DIAGNOSIS — F0391 Unspecified dementia with behavioral disturbance: Secondary | ICD-10-CM | POA: Diagnosis present

## 2017-12-14 DIAGNOSIS — Z79899 Other long term (current) drug therapy: Secondary | ICD-10-CM | POA: Diagnosis not present

## 2017-12-14 DIAGNOSIS — E782 Mixed hyperlipidemia: Secondary | ICD-10-CM | POA: Diagnosis not present

## 2017-12-14 DIAGNOSIS — F333 Major depressive disorder, recurrent, severe with psychotic symptoms: Secondary | ICD-10-CM | POA: Diagnosis not present

## 2017-12-14 LAB — URINE CULTURE

## 2017-12-14 NOTE — ED Notes (Signed)
Patient was given a snack and drink for snack.

## 2017-12-14 NOTE — ED Provider Notes (Addendum)
Pt has been accepted to [facility] by Dr. Narda Amber.  Her urine initially was concerning for UTI but her urine culture did not grow out a predominant organism.  This was likely a dirty specimen.   Malvin Johns, MD 12/14/17 1011    Malvin Johns, MD 12/14/17 1011

## 2017-12-14 NOTE — ED Notes (Signed)
[employer] called for transport

## 2017-12-14 NOTE — ED Notes (Addendum)
Patient is resting comfortably. Sitter bedside.

## 2017-12-14 NOTE — ED Notes (Signed)
RN called [facility] at [phone] and gave Report to [place]. RN faxed IVC paperwork to [phone]

## 2017-12-24 ENCOUNTER — Inpatient Hospital Stay: Payer: Medicare Other | Admitting: Family Medicine

## 2018-01-29 ENCOUNTER — Ambulatory Visit: Payer: Medicare Other | Attending: Family Medicine | Admitting: Family Medicine

## 2018-01-29 ENCOUNTER — Encounter: Payer: Self-pay | Admitting: Family Medicine

## 2018-01-29 VITALS — BP 105/63 | HR 82 | Temp 98.0°F | Ht 63.0 in | Wt 130.4 lb

## 2018-01-29 DIAGNOSIS — F03B18 Unspecified dementia, moderate, with other behavioral disturbance: Secondary | ICD-10-CM

## 2018-01-29 DIAGNOSIS — E785 Hyperlipidemia, unspecified: Secondary | ICD-10-CM

## 2018-01-29 DIAGNOSIS — Z79899 Other long term (current) drug therapy: Secondary | ICD-10-CM | POA: Insufficient documentation

## 2018-01-29 DIAGNOSIS — F0391 Unspecified dementia with behavioral disturbance: Secondary | ICD-10-CM | POA: Insufficient documentation

## 2018-01-29 MED ORDER — SIMVASTATIN 40 MG PO TABS: 40.0000 mg | ORAL_TABLET | Freq: Every day | ORAL | 1 refills | Status: DC

## 2018-01-29 MED ORDER — MEMANTINE HCL 10 MG PO TABS: 10.0000 mg | ORAL_TABLET | Freq: Two times a day (BID) | ORAL | 1 refills | Status: DC

## 2018-01-29 NOTE — Patient Instructions (Signed)
Dementia Caregiver Guide Dementia is a term used to describe a number of symptoms that affect memory and thinking. The most common symptoms include:  Memory loss.  Trouble with language and communication.  Trouble concentrating.  Poor judgment.  Problems with reasoning.  Child-like behavior and language.  Extreme anxiety.  Angry outbursts.  Wandering from home or public places.  Dementia usually gets worse slowly over time. In the early stages, people with dementia can stay independent and safe with some help. In later stages, they need help with daily tasks such as dressing, grooming, and using the bathroom. How to help the person with dementia cope Dementia can be frightening and confusing. Here are some tips to help the person with dementia cope with changes caused by the disease. General tips  Keep the person on track with his or her routine.  Try to identify areas where the person may need help.  Be supportive, patient, calm, and encouraging.  Gently remind the person that adjusting to changes takes time.  Help with the tasks that the person has asked for help with.  Keep the person involved in daily tasks and decisions as much as possible.  Encourage conversation, but try not to get frustrated or harried if the person struggles to find words or does not seem to appreciate your help. Communication tips  When the person is talking or seems frustrated, make eye contact and hold the person's hand.  Ask specific questions that need yes or no answers.  Use simple words, short sentences, and a calm voice. Only give one direction at a time.  When offering choices, limit them to just 1 or 2.  Avoid correcting the person in a negative way.  If the person is struggling to find the right words, gently try to help him or her. How to recognize symptoms of stress Symptoms of stress in caregivers include:  Feeling frustrated or angry with the person with  dementia.  Denying that the person has dementia or that his or her symptoms will not improve.  Feeling hopeless and unappreciated.  Difficulty sleeping.  Difficulty concentrating.  Feeling anxious, irritable, or depressed.  Developing stress-related health problems.  Feeling like you have too little time for your own life.  Follow these instructions at home:  Make sure that you and the person you are caring for: ? Get regular sleep. ? Exercise regularly. ? Eat regular, nutritious meals. ? Drink enough fluid to keep your urine clear or pale yellow. ? Take over-the-counter and prescription medicines only as told by your health care providers. ? Attend all scheduled health care appointments.  Join a support group with others who are caregivers.  Ask about respite care resources so that you can have a regular break from the stress of caregiving.  Look for signs of stress in yourself and in the person you are caring for. If you notice signs of stress, take steps to manage it.  Consider any safety risks and take steps to avoid them.  Organize medications in a pill box for each day of the week.  Create a plan to handle any legal or financial matters. Get legal or financial advice if needed.  Keep a calendar in a central location to remind the person of appointments or other activities. Tips for reducing the risk of injury  Keep floors clear of clutter. Remove rugs, magazine racks, and floor lamps.  Keep hallways well lit, especially at night.  Put a handrail and nonslip mat in the bathtub   or shower.  Put childproof locks on cabinets that contain dangerous items, such as medicines, alcohol, guns, toxic cleaning items, sharp tools or utensils, matches, and lighters.  Put the locks in places where the person cannot see or reach them easily. This will help ensure that the person does not wander out of the house and get lost.  Be prepared for emergencies. Keep a list of  emergency phone numbers and addresses in a convenient area.  Remove car keys and lock garage doors so that the person does not try to get in the car and drive.  Have the person wear a bracelet that tracks locations and identifies the person as having memory problems. This should be worn at all times for safety. Where to find support: Many individuals and organizations offer support. These include:  Support groups for people with dementia and for caregivers.  Counselors or therapists.  Home health care services.  Adult day care centers.  Where to find more information: Alzheimer's Association: www.alz.org Contact a health care provider if:  The person's health is rapidly getting worse.  You are no longer able to care for the person.  Caring for the person is affecting your physical and emotional health.  The person threatens himself or herself, you, or anyone else. Summary  Dementia is a term used to describe a number of symptoms that affect memory and thinking.  Dementia usually gets worse slowly over time.  Take steps to reduce the person's risk of injury, and to plan for future care.  Caregivers need support, relief from caregiving, and time for their own lives. This information is not intended to replace advice given to you by your health care provider. Make sure you discuss any questions you have with your health care provider. Document Released: 05/02/2016 Document Revised: 05/02/2016 Document Reviewed: 05/02/2016 Elsevier Interactive Patient Education  2018 [employer]  

## 2018-01-29 NOTE — Progress Notes (Signed)
Subjective:  Patient ID: Rhonda Peterson, female    DOB: 20-Apr-1951  Age: 67 y.o. MRN: [id number]  CC: Dementia   HPI Rhonda Peterson is a 67 year old female with a history of hyperlipidemia, dementia with behavioral disturbances here for follow-up visit accompanied by her spouse.  She was admitted to a facility in [place], [place] last month after she had presented to the ED with confusion, wandering behavior. Her spouse informs me she wandered off again 3 days ago and was found by the neighbors.  The patient tells me she went to make purchases at the store. She endorses compliance with her medications. Denies chest pains, shortness of breath.  Past Medical History:  Diagnosis Date  . Concussion   . Dementia   . Depression   . Headache(784.0)   . Memory loss   . Migraine     Past Surgical History:  Procedure Laterality Date  . BACK SURGERY    . HAND SURGERY Bilateral   . KNEE SURGERY      No Known Allergies   Outpatient Medications Prior to Visit  Medication Sig Dispense Refill  . donepezil (ARICEPT) 10 MG tablet Take 1 tablet (10 mg total) by mouth at bedtime. 30 tablet 6  . simvastatin (ZOCOR) 40 MG tablet Take 1 tablet (40 mg total) by mouth at bedtime. 90 tablet 6  . acetaminophen (TYLENOL) 500 MG tablet Take 1,000 mg by mouth every 6 (six) hours as needed for moderate pain.    . diphenhydrAMINE (BENADRYL) 25 MG tablet Take 25 mg by mouth as needed for allergies.    Marland Kitchen ibuprofen (ADVIL,MOTRIN) 200 MG tablet Take 400 mg by mouth every 6 (six) hours as needed for moderate pain.    . Misc. Devices (CANE) MISC Use can to help assist with ambulation and reduce falls (Patient not taking: Reported on 07/12/2016) 1 each 0  . Multiple Vitamin (MULTIVITAMIN WITH MINERALS) TABS tablet Take 1 tablet by mouth daily.    Marland Kitchen Phenylephrine-DM-GG-APAP (TYLENOL COLD/FLU SEVERE PO) Take 1 tablet by mouth as needed (cold).    . triamcinolone ointment (KENALOG) 0.5 % Apply 1 application topically 2  (two) times daily. (Patient not taking: Reported on 02/19/2017) 30 g 0   No facility-administered medications prior to visit.     ROS Review of Systems  Constitutional: Negative for activity change, appetite change and fatigue.  HENT: Negative for congestion, sinus pressure and sore throat.   Eyes: Negative for visual disturbance.  Respiratory: Negative for cough, chest tightness, shortness of breath and wheezing.   Cardiovascular: Negative for chest pain and palpitations.  Gastrointestinal: Negative for abdominal distention, abdominal pain and constipation.  Endocrine: Negative for polydipsia.  Genitourinary: Negative for dysuria and frequency.  Musculoskeletal: Negative for arthralgias and back pain.  Skin: Negative for rash.  Neurological: Negative for tremors, light-headedness and numbness.  Hematological: Does not bruise/bleed easily.  Psychiatric/Behavioral: Positive for behavioral problems. Negative for agitation.    Objective:  BP 105/63   Pulse 82   Temp 98 F (36.7 C) (Oral)   Ht 5\' 3"  (1.6 m)   Wt 130 lb 6.4 oz (59.1 kg)   SpO2 100%   BMI 23.10 kg/m   BP/Weight 01/29/2018 01/13/6961 02/15/2840  Systolic BP 324 401 -  Diastolic BP 63 80 -  Wt. (Lbs) 130.4 - 132  BMI 23.1 - 22.66  Some encounter information is confidential and restricted. Go to Review Flowsheets activity to see all data.      Physical Exam  Constitutional: She appears well-developed and well-nourished.  Cardiovascular: Normal rate, normal heart sounds and intact distal pulses.  No murmur heard. Pulmonary/Chest: Effort normal and breath sounds normal. She has no wheezes. She has no rales. She exhibits no tenderness.  Abdominal: Soft. Bowel sounds are normal. She exhibits no distension and no mass. There is no tenderness.  Musculoskeletal: Normal range of motion.  Neurological:  Alert but not fully oriented Tangential thoughts and speech with confabulation  Skin: Skin is warm and dry.    Psychiatric: She has a normal mood and affect.    CMP Latest Ref Rng & Units 12/12/2017 07/17/2017 06/20/2017  Glucose 70 - 99 mg/dL 78 82 69  BUN 8 - 23 mg/dL 11 17 13   Creatinine 0.44 - 1.00 mg/dL 0.75 0.58 0.80  Sodium 135 - 145 mmol/L 144 141 144  Potassium 3.5 - 5.1 mmol/L 3.7 3.9 3.7  Chloride 98 - 111 mmol/L 104 107 105  CO2 22 - 32 mmol/L 27 26 28   Calcium 8.9 - 10.3 mg/dL 9.5 9.3 9.3  Total Protein 6.5 - 8.1 g/dL 7.4 7.7 7.1  Total Bilirubin 0.3 - 1.2 mg/dL 1.1 0.5 0.7  Alkaline Phos 38 - 126 U/L 34(L) 41 40  AST 15 - 41 U/L 17 17 16   ALT 0 - 44 U/L 13 11(L) 10    Lipid Panel     Component Value Date/Time   CHOL 271 (H) 06/20/2017 1039   TRIG 106 06/20/2017 1039   HDL 58 06/20/2017 1039   CHOLHDL 4.7 (H) 06/20/2017 1039   CHOLHDL 5.6 (H) 07/12/2016 1029   VLDL 20 07/12/2016 1029   LDLCALC 192 (H) 06/20/2017 1039    Assessment & Plan:   1. Dyslipidemia Uncontrolled due to previous noncompliance She has remained compliant at this time - simvastatin (ZOCOR) 40 MG tablet; Take 1 tablet (40 mg total) by mouth at bedtime.  Dispense: 90 tablet; Refill: 1  2. Moderate dementia with behavioral disturbance Discussed with husband the need to use support of family and friends Consider psych referral if symptoms persist - memantine (NAMENDA) 10 MG tablet; Take 1 tablet (10 mg total) by mouth 2 (two) times daily.  Dispense: 180 tablet; Refill: 1   Meds ordered this encounter  Medications  . memantine (NAMENDA) 10 MG tablet    Sig: Take 1 tablet (10 mg total) by mouth 2 (two) times daily.    Dispense:  180 tablet    Refill:  1    Discontinue Aricept  . simvastatin (ZOCOR) 40 MG tablet    Sig: Take 1 tablet (40 mg total) by mouth at bedtime.    Dispense:  90 tablet    Refill:  1    Follow-up: Return in about 6 months (around 08/01/2018) for Follow-up of chronic medical conditions.   Charlott Rakes MD

## 2018-05-01 ENCOUNTER — Telehealth: Payer: Self-pay | Admitting: Diagnostic Neuroimaging

## 2018-05-01 NOTE — Telephone Encounter (Signed)
Called daughter back and she was asking about in home care for mother, not memory care.  I relayed that per [facility], they do not pay for in home care (usually is an out of pocket expense) like home instead, [facility].  I will send her dementia packet in mail ([facility]) for information.  She has not been in touch with insurance/ pt has medicare.  She will call back as needed. Placed packet in mail.

## 2018-05-01 NOTE — Telephone Encounter (Signed)
Error

## 2018-05-01 NOTE — Telephone Encounter (Signed)
Spoke to daughter and she was at appointment for her daughter and could not speak.  Will call her back.

## 2018-05-01 NOTE — Telephone Encounter (Signed)
Patient's daughter (on [place]) came in today and requested to see if she could speak with a nurse or MD about her mother getting an in home care taker that would be provided possibly by her insurance company. Her mother has dementia and patient's daughter states that it is getting to the point where she needs a caregiver to supervise her. Patient's daughter states that her mother is now packing a bag and walking different places. States that her mother is even getting up at odd hours of the night / morning and leaving the house. She states her mother's condition is worsening and she is hallucinating.   Daughter's direct phone number is: [phone]

## 2018-05-13 ENCOUNTER — Encounter: Payer: Self-pay | Admitting: Family Medicine

## 2018-05-13 ENCOUNTER — Ambulatory Visit: Payer: Medicare Other | Attending: Family Medicine | Admitting: Family Medicine

## 2018-05-13 VITALS — BP 139/84 | HR 75 | Temp 97.6°F | Ht 63.0 in | Wt 137.4 lb

## 2018-05-13 DIAGNOSIS — Z791 Long term (current) use of non-steroidal anti-inflammatories (NSAID): Secondary | ICD-10-CM | POA: Diagnosis not present

## 2018-05-13 DIAGNOSIS — E785 Hyperlipidemia, unspecified: Secondary | ICD-10-CM | POA: Diagnosis not present

## 2018-05-13 DIAGNOSIS — F329 Major depressive disorder, single episode, unspecified: Secondary | ICD-10-CM | POA: Insufficient documentation

## 2018-05-13 DIAGNOSIS — F0391 Unspecified dementia with behavioral disturbance: Secondary | ICD-10-CM | POA: Diagnosis not present

## 2018-05-13 DIAGNOSIS — F03B18 Unspecified dementia, moderate, with other behavioral disturbance: Secondary | ICD-10-CM

## 2018-05-13 DIAGNOSIS — Z79899 Other long term (current) drug therapy: Secondary | ICD-10-CM | POA: Diagnosis not present

## 2018-05-13 DIAGNOSIS — F22 Delusional disorders: Secondary | ICD-10-CM | POA: Insufficient documentation

## 2018-05-13 DIAGNOSIS — R51 Headache: Secondary | ICD-10-CM | POA: Diagnosis present

## 2018-05-13 NOTE — Progress Notes (Signed)
Patient is having pain in top of head.
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
## 2018-05-20 DIAGNOSIS — Z23 Encounter for immunization: Secondary | ICD-10-CM | POA: Diagnosis not present

## 2018-07-24 ENCOUNTER — Encounter: Payer: Self-pay | Admitting: Family Medicine

## 2018-07-24 ENCOUNTER — Ambulatory Visit: Payer: Medicare Other | Attending: Family Medicine | Admitting: Family Medicine

## 2018-07-24 VITALS — BP 126/70 | HR 73 | Temp 98.0°F | Ht 63.0 in | Wt 137.4 lb

## 2018-07-24 DIAGNOSIS — F0391 Unspecified dementia with behavioral disturbance: Secondary | ICD-10-CM | POA: Diagnosis not present

## 2018-07-24 DIAGNOSIS — E2839 Other primary ovarian failure: Secondary | ICD-10-CM | POA: Insufficient documentation

## 2018-07-24 DIAGNOSIS — F03B18 Unspecified dementia, moderate, with other behavioral disturbance: Secondary | ICD-10-CM

## 2018-07-24 DIAGNOSIS — E785 Hyperlipidemia, unspecified: Secondary | ICD-10-CM | POA: Diagnosis not present

## 2018-07-24 DIAGNOSIS — Z79899 Other long term (current) drug therapy: Secondary | ICD-10-CM | POA: Insufficient documentation

## 2018-07-24 MED ORDER — MEMANTINE HCL 10 MG PO TABS: 10.0000 mg | ORAL_TABLET | Freq: Two times a day (BID) | ORAL | 1 refills | Status: DC

## 2018-07-24 MED ORDER — SIMVASTATIN 40 MG PO TABS: 40.0000 mg | ORAL_TABLET | Freq: Every day | ORAL | 1 refills | Status: DC

## 2018-07-24 NOTE — Progress Notes (Signed)
Subjective:  Patient ID: Rhonda Peterson, female    DOB: 03/03/1951  Age: 68 y.o. MRN: [id number]  CC: Dementia   HPI Rhonda Peterson  is a 68 year old female with a history of hyperlipidemia, dementia with behavioral disturbances here for follow-up visit accompanied by her spouse.  She endorses compliance with her medications but this is refuted by her husband. She informs me she is healthy for her age and is involved in a lot of exercises including running which her husband denies. Today she is due for a  pneumonia shot but she informs me a nurse came to her home and administered the Pneumonia vaccine because it was snowing and she could not make it outside and this is again denied by her husband but the patient sticks to her version of the story. As per her spouse she misplaces her money and accuses people of stealing them but the patient informs me she puts her money away in a safe place.  She has no concerns today.  Past Medical History:  Diagnosis Date  . Concussion   . Dementia ([place])   . Depression   . Headache(784.0)   . Memory loss   . Migraine     Past Surgical History:  Procedure Laterality Date  . BACK SURGERY    . HAND SURGERY Bilateral   . KNEE SURGERY      History reviewed. No pertinent family history.  No Known Allergies  Outpatient Medications Prior to Visit  Medication Sig Dispense Refill  . simvastatin (ZOCOR) 40 MG tablet Take 1 tablet (40 mg total) by mouth at bedtime. 90 tablet 1  . acetaminophen (TYLENOL) 500 MG tablet Take 1,000 mg by mouth every 6 (six) hours as needed for moderate pain.    . diphenhydrAMINE (BENADRYL) 25 MG tablet Take 25 mg by mouth as needed for allergies.    Marland Kitchen ibuprofen (ADVIL,MOTRIN) 200 MG tablet Take 400 mg by mouth every 6 (six) hours as needed for moderate pain.    . Misc. Devices (CANE) MISC Use can to help assist with ambulation and reduce falls (Patient not taking: Reported on 07/12/2016) 1 each 0  . Multiple Vitamin  (MULTIVITAMIN WITH MINERALS) TABS tablet Take 1 tablet by mouth daily.    Marland Kitchen Phenylephrine-DM-GG-APAP (TYLENOL COLD/FLU SEVERE PO) Take 1 tablet by mouth as needed (cold).    . triamcinolone ointment (KENALOG) 0.5 % Apply 1 application topically 2 (two) times daily. (Patient not taking: Reported on 02/19/2017) 30 g 0  . memantine (NAMENDA) 10 MG tablet Take 1 tablet (10 mg total) by mouth 2 (two) times daily. (Patient not taking: Reported on 07/24/2018) 180 tablet 1   No facility-administered medications prior to visit.      ROS Review of Systems  Constitutional: Negative for activity change, appetite change and fatigue.  HENT: Negative for congestion, sinus pressure and sore throat.   Eyes: Negative for visual disturbance.  Respiratory: Negative for cough, chest tightness, shortness of breath and wheezing.   Cardiovascular: Negative for chest pain and palpitations.  Gastrointestinal: Negative for abdominal distention, abdominal pain and constipation.  Endocrine: Negative for polydipsia.  Genitourinary: Negative for dysuria and frequency.  Musculoskeletal: Negative for arthralgias and back pain.  Skin: Negative for rash.  Neurological: Negative for tremors, light-headedness and numbness.  Hematological: Does not bruise/bleed easily.  Psychiatric/Behavioral: Positive for behavioral problems. Negative for agitation.    Objective:  BP 126/70   Pulse 73   Temp 98 F (36.7 C) (Oral)  Ht 5\' 3"  (1.6 m)   Wt 137 lb 6.4 oz (62.3 kg)   SpO2 100%   BMI 24.34 kg/m   BP/Weight 07/24/2018 05/13/2018 0/93/2671  Systolic BP 245 809 983  Diastolic BP 70 84 63  Wt. (Lbs) 137.4 137.4 130.4  BMI 24.34 24.34 23.1  Some encounter information is confidential and restricted. Go to Review Flowsheets activity to see all data.      Physical Exam Constitutional: normal appearing,  Eyes: PERRLA HEENT: Head is atraumatic, normal sinuses, normal oropharynx, normal appearing tonsils and palate,  tympanic membrane is normal bilaterally. Neck: normal range of motion, no thyromegaly, no JVD Cardiovascular: normal rate and rhythm, normal heart sounds, no murmurs, rub or gallop, no pedal edema Respiratory: Normal breath sounds, clear to auscultation bilaterally, no wheezes, no rales, no rhonchi Abdomen: soft, not tender to palpation, normal bowel sounds, no enlarged organs Musculoskeletal: Full ROM, no tenderness in joints Skin: warm and dry, no lesions. Neurological: alert,  Psychological: normal mood.   CMP Latest Ref Rng & Units 12/12/2017 07/17/2017 06/20/2017  Glucose 70 - 99 mg/dL 78 82 69  BUN 8 - 23 mg/dL 11 17 13   Creatinine 0.44 - 1.00 mg/dL 0.75 0.58 0.80  Sodium 135 - 145 mmol/L 144 141 144  Potassium 3.5 - 5.1 mmol/L 3.7 3.9 3.7  Chloride 98 - 111 mmol/L 104 107 105  CO2 22 - 32 mmol/L 27 26 28   Calcium 8.9 - 10.3 mg/dL 9.5 9.3 9.3  Total Protein 6.5 - 8.1 g/dL 7.4 7.7 7.1  Total Bilirubin 0.3 - 1.2 mg/dL 1.1 0.5 0.7  Alkaline Phos 38 - 126 U/L 34(L) 41 40  AST 15 - 41 U/L 17 17 16   ALT 0 - 44 U/L 13 11(L) 10    Lipid Panel     Component Value Date/Time   CHOL 271 (H) 06/20/2017 1039   TRIG 106 06/20/2017 1039   HDL 58 06/20/2017 1039   CHOLHDL 4.7 (H) 06/20/2017 1039   CHOLHDL 5.6 (H) 07/12/2016 1029   VLDL 20 07/12/2016 1029   LDLCALC 192 (H) 06/20/2017 1039    CBC    Component Value Date/Time   WBC 4.4 12/12/2017 1452   RBC 4.10 12/12/2017 1452   HGB 12.0 12/12/2017 1452   HGB 11.2 06/20/2017 1039   HCT 38.7 12/12/2017 1452   HCT 34.9 06/20/2017 1039   PLT 258 12/12/2017 1452   PLT 273 06/20/2017 1039   MCV 94.4 12/12/2017 1452   MCV 90 06/20/2017 1039   MCH 29.3 12/12/2017 1452   MCHC 31.0 12/12/2017 1452   RDW 12.1 12/12/2017 1452   RDW 13.8 06/20/2017 1039   LYMPHSABS 1.4 12/12/2017 1452   LYMPHSABS 1.0 06/20/2017 1039   MONOABS 0.4 12/12/2017 1452   EOSABS 0.1 12/12/2017 1452   EOSABS 0.1 06/20/2017 1039   BASOSABS 0.0 12/12/2017 1452    BASOSABS 0.0 06/20/2017 1039    Lab Results  Component Value Date   HGBA1C 5.30 09/08/2014    Assessment & Plan:   1. Moderate dementia with behavioral disturbance ([place]) Not compliant with Namenda - memantine (NAMENDA) 10 MG tablet; Take 1 tablet (10 mg total) by mouth 2 (two) times daily.  Dispense: 180 tablet; Refill: 1  2. Dyslipidemia Controlled Lipid panel at next visit - simvastatin (ZOCOR) 40 MG tablet; Take 1 tablet (40 mg total) by mouth at bedtime.  Dispense: 90 tablet; Refill: 1  3. Estrogen deficiency - DG Bone Density; Future  Due for Prevnar 13.  Will coordinate this with pharmacy.  Meds ordered this encounter  Medications  . memantine (NAMENDA) 10 MG tablet    Sig: Take 1 tablet (10 mg total) by mouth 2 (two) times daily.    Dispense:  180 tablet    Refill:  1  . simvastatin (ZOCOR) 40 MG tablet    Sig: Take 1 tablet (40 mg total) by mouth at bedtime.    Dispense:  90 tablet    Refill:  1    Follow-up: Return in about 6 months (around 01/22/2019) for Follow-up of chronic medical conditions.       Charlott Rakes, MD, FAAFP. [facility] and [facility], [place]   07/24/2018, 4:49 PM

## 2018-07-26 ENCOUNTER — Ambulatory Visit
Admission: RE | Admit: 2018-07-26 | Discharge: 2018-07-26 | Disposition: A | Discharge status: Home / Self Care | Payer: Medicare Other | Source: Ambulatory Visit | Attending: Family Medicine | Admitting: Family Medicine

## 2018-07-26 DIAGNOSIS — Z780 Asymptomatic menopausal state: Secondary | ICD-10-CM | POA: Diagnosis not present

## 2018-07-26 DIAGNOSIS — E2839 Other primary ovarian failure: Secondary | ICD-10-CM

## 2018-07-26 DIAGNOSIS — Z13820 Encounter for screening for osteoporosis: Secondary | ICD-10-CM | POA: Diagnosis not present

## 2018-08-06 ENCOUNTER — Encounter (HOSPITAL_COMMUNITY): Payer: Self-pay | Admitting: Emergency Medicine

## 2018-08-06 ENCOUNTER — Emergency Department (HOSPITAL_COMMUNITY)
Admission: EM | Admit: 2018-08-06 | Discharge: 2018-08-06 | Disposition: A | Discharge status: Home / Self Care | Payer: Medicare Other | Attending: Emergency Medicine | Admitting: Emergency Medicine

## 2018-08-06 ENCOUNTER — Emergency Department (HOSPITAL_COMMUNITY): Payer: Medicare Other

## 2018-08-06 DIAGNOSIS — R410 Disorientation, unspecified: Secondary | ICD-10-CM | POA: Diagnosis not present

## 2018-08-06 DIAGNOSIS — F0391 Unspecified dementia with behavioral disturbance: Secondary | ICD-10-CM | POA: Diagnosis not present

## 2018-08-06 DIAGNOSIS — R4182 Altered mental status, unspecified: Secondary | ICD-10-CM | POA: Diagnosis present

## 2018-08-06 DIAGNOSIS — Z79899 Other long term (current) drug therapy: Secondary | ICD-10-CM | POA: Insufficient documentation

## 2018-08-06 LAB — CBC
HCT: 39.6 % (ref 36.0–46.0)
Hemoglobin: 12.2 g/dL (ref 12.0–15.0)
MCH: 29.3 pg (ref 26.0–34.0)
MCHC: 30.8 g/dL (ref 30.0–36.0)
MCV: 95 fL (ref 80.0–100.0)
Platelets: 254 10*3/uL (ref 150–400)
RBC: 4.17 MIL/uL (ref 3.87–5.11)
RDW: 11.9 % (ref 11.5–15.5)
WBC: 3.3 10*3/uL — ABNORMAL LOW (ref 4.0–10.5)
nRBC: 0 % (ref 0.0–0.2)

## 2018-08-06 LAB — COMPREHENSIVE METABOLIC PANEL
ALT: 20 U/L (ref 0–44)
AST: 24 U/L (ref 15–41)
Albumin: 4.3 g/dL (ref 3.5–5.0)
Alkaline Phosphatase: 34 U/L — ABNORMAL LOW (ref 38–126)
Anion gap: 7 (ref 5–15)
BUN: 11 mg/dL (ref 8–23)
CO2: 26 mmol/L (ref 22–32)
Calcium: 9.2 mg/dL (ref 8.9–10.3)
Chloride: 107 mmol/L (ref 98–111)
Creatinine, Ser: 0.73 mg/dL (ref 0.44–1.00)
GFR calc Af Amer: >60 mL/min (ref >60)
GFR calc non Af Amer: >60 mL/min (ref >60)
Glucose, Bld: 89 mg/dL (ref 70–99)
Potassium: 3.7 mmol/L (ref 3.5–5.1)
Sodium: 140 mmol/L (ref 135–145)
Total Bilirubin: 1.4 mg/dL — ABNORMAL HIGH (ref 0.3–1.2)
Total Protein: 7.8 g/dL (ref 6.5–8.1)

## 2018-08-06 LAB — CBG MONITORING, ED: Glucose-Capillary: 71 mg/dL (ref 70–99)

## 2018-08-06 MED ORDER — SODIUM CHLORIDE 0.9% FLUSH: 3.0000 mL | Freq: Once | INTRAVENOUS | Status: DC
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
## 2018-08-06 NOTE — Progress Notes (Signed)
CSW spoke with pt and husband at bedside. CSW was made aware that husband has concerns around pt wandering the streets at different times of the day. CSW provided husband and pt with [place] resources. CSW explained to husband and pt requirements for placement often times intotheses facilities. Husband expressed that he would look over resources a follow up with facilities.    No further CSW needs at this time. CSW will sign off.      Virgie Dad. Fionna Merriott, MSW, [facility] Clinical Social Worker [phone]
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
## 2018-08-06 NOTE — Discharge Planning (Signed)
[facility] and EDSW consulted regarding memory care placement due to pt wandering.  EDCM and EDSW met with pt and husband at bedside.  Please see EDSW note.

## 2018-08-13 ENCOUNTER — Other Ambulatory Visit: Payer: Self-pay | Admitting: *Deleted
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
## 2018-08-17 ENCOUNTER — Other Ambulatory Visit: Payer: Self-pay | Admitting: *Deleted

## 2018-08-17 NOTE — Patient Outreach (Signed)
[place] [facility]) Care Management  08/17/2018  Rhonda Peterson August 13, 1950 [id number]   CSW attempted to reach pt's daughter by phone again on 08/16/2018 and was unsuccessful- voice mail full.  CSW will attempt again next week.   Eduard Clos, MSW, [place] Worker  [place] [phone]

## 2018-08-19 ENCOUNTER — Other Ambulatory Visit: Payer: Self-pay | Admitting: *Deleted

## 2018-08-19 NOTE — Patient Outreach (Signed)
[facility] [facility]) Care Management  08/19/2018  Rhonda Peterson 1951-03-08 [id number]   CSW made contact with pt's daughter today who provided additional info regarding pt and her needs. Per daughter, pt lives with her husband and a son. Pt has had some wandering and memory issues and per daughter is not eligible for memory care unit in [place]. She is wanting to consider some in home care as well as being open to resources that may be of benefit.  CSW will mail daughter community resources and then plan a follow up call for further assessing and support.  Per daughter she is looking into pursuing Guardianship and is encouraged to seek legal support for this.   CSW will f/u in 1-2 weeks.   Eduard Clos, MSW, [place] Worker  [place] [phone]

## 2018-08-29 ENCOUNTER — Ambulatory Visit: Payer: Self-pay | Admitting: *Deleted

## 2018-09-03 ENCOUNTER — Ambulatory Visit: Payer: Self-pay | Admitting: *Deleted

## 2018-09-03 ENCOUNTER — Other Ambulatory Visit: Payer: Self-pay | Admitting: *Deleted

## 2018-09-03 NOTE — Patient Outreach (Signed)
[facility]) Care Management  09/03/2018  Rhonda Peterson 11-28-1950 [id number]   CSW spoke with pt's spouse who states they are heading out to get some food. He states they have not been able to secure any caregivers (given the COVID) and wants to "get back with ya" if needs arise.   CSW also contacted pt's daughter for follow up as well and left HIPPA compliant message.   CSW will plan a follow up call/attempt in the next 3-4 business days.   Eduard Clos, MSW, [place] Worker  [place] [phone]

## 2018-09-09 ENCOUNTER — Ambulatory Visit: Payer: Medicare Other | Admitting: *Deleted

## 2018-09-09 ENCOUNTER — Ambulatory Visit: Payer: Self-pay | Admitting: *Deleted

## 2018-09-10 ENCOUNTER — Other Ambulatory Visit: Payer: Self-pay | Admitting: *Deleted

## 2018-09-10 NOTE — Patient Outreach (Signed)
[facility]) Care Management  09/10/2018  Rhonda Peterson 08-03-1950 [id number]   CSW was unable to reach pt/family today. CSW will try again in 3-4 business days if no return call.   Eduard Clos, MSW, [place] Worker  [place] [phone]

## 2018-09-13 ENCOUNTER — Other Ambulatory Visit: Payer: Self-pay | Admitting: *Deleted

## 2018-09-13 NOTE — Patient Outreach (Signed)
[facility]) Care Management  09/13/2018  Rhonda Peterson Feb 07, 1951 [id number]   CSW spoke with pt's spouse by phone and discussed memory care resources again. He states he is putting it all "on hold because of COVID". CSW offered to follow up towards the end of the month to which he declines; stating, he will reach out to me if needs change.  CSW also contacted daughter to check in and update her on the above plans- CSW will mail resource info to her address and then sign off.  Daughter has not pursued Guardianship yet but states she still plans to look into it.   CSW will plan to signoff and advise PCP and [facility] team of above.     Eduard Clos, MSW, [place] Worker  [place] [phone]

## 2018-09-16 ENCOUNTER — Encounter: Payer: Self-pay | Admitting: *Deleted

## 2018-09-18 ENCOUNTER — Other Ambulatory Visit: Payer: Self-pay | Admitting: *Deleted

## 2018-09-18 NOTE — Patient Outreach (Addendum)
[facility]) Care Management  09/18/2018  Rhonda Peterson 07/20/1950 [id number]   Care coordination  Mr Rhonda Peterson had left a voice message requesting assistanace for [name]  [facility] RN CM returned the call   CM spoke with the husband Mr Rhonda Peterson as the pt has dementia  She is present in the room with him and initially spoke with Cm but gave Mr Rhonda Peterson the phone Mr Rhonda Peterson was able to verify HIPAA He states the daughter is attempting to apply for medicaid and was inquiring how to get medical records for [name] from [place] assisted by giving the HIM contact number [phone] option 2 and the email site [email or web address]   He voiced appreciation of a return call    [place]. Lavina Hamman, RN, BSN, [place] Coordinator Office number [phone] Mobile number [phone]  Main [facility] number [phone] Fax number [phone]

## 2018-11-20 ENCOUNTER — Other Ambulatory Visit: Payer: Self-pay | Admitting: *Deleted

## 2018-11-20 DIAGNOSIS — Z20822 Contact with and (suspected) exposure to covid-19: Secondary | ICD-10-CM

## 2018-11-21 LAB — NOVEL CORONAVIRUS, NAA: SARS-CoV-2, NAA: NOT DETECTED

## 2018-12-11 ENCOUNTER — Emergency Department (HOSPITAL_COMMUNITY)
Admission: EM | Admit: 2018-12-11 | Discharge: 2018-12-11 | Disposition: A | Discharge status: Home / Self Care | Payer: Medicare Other | Attending: Emergency Medicine | Admitting: Emergency Medicine

## 2018-12-11 ENCOUNTER — Encounter (HOSPITAL_COMMUNITY): Payer: Self-pay

## 2018-12-11 ENCOUNTER — Other Ambulatory Visit: Payer: Self-pay

## 2018-12-11 DIAGNOSIS — G309 Alzheimer's disease, unspecified: Secondary | ICD-10-CM | POA: Insufficient documentation

## 2018-12-11 DIAGNOSIS — R4182 Altered mental status, unspecified: Secondary | ICD-10-CM | POA: Diagnosis present

## 2018-12-11 DIAGNOSIS — R410 Disorientation, unspecified: Secondary | ICD-10-CM | POA: Diagnosis not present

## 2018-12-11 DIAGNOSIS — F0281 Dementia in other diseases classified elsewhere with behavioral disturbance: Secondary | ICD-10-CM | POA: Diagnosis not present

## 2018-12-11 DIAGNOSIS — Z79899 Other long term (current) drug therapy: Secondary | ICD-10-CM | POA: Diagnosis not present

## 2018-12-11 LAB — URINALYSIS, ROUTINE W REFLEX MICROSCOPIC
Bilirubin Urine: NEGATIVE
Glucose, UA: NEGATIVE mg/dL
Hgb urine dipstick: NEGATIVE
Ketones, ur: NEGATIVE mg/dL
Leukocytes,Ua: NEGATIVE
Nitrite: NEGATIVE
Protein, ur: NEGATIVE mg/dL
Specific Gravity, Urine: 1.021 (ref 1.005–1.030)
pH: 6 (ref 5.0–8.0)

## 2018-12-11 LAB — I-STAT CHEM 8, ED
BUN: 14 mg/dL (ref 8–23)
Calcium, Ion: 1.17 mmol/L (ref 1.15–1.40)
Chloride: 107 mmol/L (ref 98–111)
Creatinine, Ser: 0.7 mg/dL (ref 0.44–1.00)
Glucose, Bld: 89 mg/dL (ref 70–99)
HCT: 34 % — ABNORMAL LOW (ref 36.0–46.0)
Hemoglobin: 11.6 g/dL — ABNORMAL LOW (ref 12.0–15.0)
Potassium: 4.3 mmol/L (ref 3.5–5.1)
Sodium: 144 mmol/L (ref 135–145)
TCO2: 26 mmol/L (ref 22–32)

## 2018-12-11 NOTE — Progress Notes (Signed)
CSW called pt's spouse X2 with a HIPPA-compliant VM stating pt is ready for D/C.  CSW will continue to follow for D/C needs.  Alphonse Guild. Altair Stanko, LCSW, LCAS, CSI Transitions of Care Clinical Social Worker Care Coordination Department Ph: [phone]

## 2018-12-11 NOTE — Progress Notes (Signed)
CSW called non-emergency dispatch to request [employer] conduct a welfare check to insure pt's spouse is aware pt is ready for D/C.  [employer] will be dispatched ASAP.  CSW will continue to follow for D/C needs.  Alphonse Guild. Erman Thum, LCSW, LCAS, CSI Transitions of Care Clinical Social Worker Care Coordination Department Ph: [phone]
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
## 2018-12-11 NOTE — Progress Notes (Signed)
Consult request has been received. CSW attempting to follow up at present time.  CSW reviewed char and sees that multiple conversations have taken place between:  1. [facility] CSW's/CM's with husband about placement/Memory Care 2. EDP's and pt's husband and family about Memory Care  Notes reflect that many times pt's spouse was not presenting as interested and declined follow up calls from [facility] Case Managers offering to assist pt's spouse with placement assistance.  Per chart pt has Medicare only which DOES NOT pay for Memory Care, but only pays for Skilled Nursing Rehab for which pt is not appropriate as pt can ambulate and demonstrates a tendency to wander.  CSW called pt's husband Rhonda Peterson,Rhonda Peterson at ph: [phone] and left a HIPPA-compliant VM requesting a call back.  Marylou Flesher, LCSW, [place] Social Worker [phone]

## 2018-12-11 NOTE — ED Notes (Signed)
Bed: OF75 Expected date:  Expected time:  Means of arrival:  Comments: EMS/Alzheimer's no c/o

## 2018-12-11 NOTE — Progress Notes (Signed)
CSW spoke with pt's spouse and asked if the CSW could assist with questions or concerns with placement or assistance from within the community and pt's spouse stated that while he was previously considering placement, he had now decided to wait due to the COVID crisis.  CSW advised pt's spouse about the need to secure a PAYOR for the pt's possible/eventual ALF/Memory Care placement and pt's spouse stated he was aware and was currently seeking Medicaid for his wife.  CSW stated he was standing by should pt/pt's spouse need further social work assistance and pt's spouse was Patent attorney and thanked the CSW.  EDP updated.  CSW will continue to follow for D/C needs.  Alphonse Guild. Ranvir Renovato, LCSW, LCAS, CSI Transitions of Care Clinical Social Worker Care Coordination Department Ph: [phone]
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
## 2018-12-18 ENCOUNTER — Other Ambulatory Visit: Payer: Self-pay | Admitting: Family Medicine

## 2018-12-18 DIAGNOSIS — Z1231 Encounter for screening mammogram for malignant neoplasm of breast: Secondary | ICD-10-CM

## 2019-01-14 ENCOUNTER — Ambulatory Visit (INDEPENDENT_AMBULATORY_CARE_PROVIDER_SITE_OTHER): Payer: Medicare Other | Admitting: Family Medicine

## 2019-01-14 ENCOUNTER — Encounter: Payer: Self-pay | Admitting: Family Medicine

## 2019-01-14 ENCOUNTER — Other Ambulatory Visit: Payer: Self-pay

## 2019-01-14 ENCOUNTER — Ambulatory Visit: Payer: Medicare Other | Admitting: Family Medicine

## 2019-01-14 VITALS — BP 136/87 | HR 76 | Temp 96.8°F | Ht 63.0 in | Wt 133.8 lb

## 2019-01-14 DIAGNOSIS — F0391 Unspecified dementia with behavioral disturbance: Secondary | ICD-10-CM | POA: Diagnosis not present

## 2019-01-14 DIAGNOSIS — F03B18 Unspecified dementia, moderate, with other behavioral disturbance: Secondary | ICD-10-CM
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
## 2019-01-14 NOTE — Patient Instructions (Signed)
Continue with placement plans, we will focus care on safety as discussed.  Follow up as needed  Dementia Caregiver Guide Dementia is a term used to describe a number of symptoms that affect memory and thinking. The most common symptoms include:  Memory loss.  Trouble with language and communication.  Trouble concentrating.  Poor judgment.  Problems with reasoning.  Child-like behavior and language.  Extreme anxiety.  Angry outbursts.  Wandering from home or public places. Dementia usually gets worse slowly over time. In the early stages, people with dementia can stay independent and safe with some help. In later stages, they need help with daily tasks such as dressing, grooming, and using the bathroom. How to help the person with dementia cope Dementia can be frightening and confusing. Here are some tips to help the person with dementia cope with changes caused by the disease. General tips  Keep the person on track with his or her routine.  Try to identify areas where the person may need help.  Be supportive, patient, calm, and encouraging.  Gently remind the person that adjusting to changes takes time.  Help with the tasks that the person has asked for help with.  Keep the person involved in daily tasks and decisions as much as possible.  Encourage conversation, but try not to get frustrated or harried if the person struggles to find words or does not seem to appreciate your help. Communication tips  When the person is talking or seems frustrated, make eye contact and hold the person's hand.  Ask specific questions that need yes or no answers.  Use simple words, short sentences, and a calm voice. Only give one direction at a time.  When offering choices, limit them to just 1 or 2.  Avoid correcting the person in a negative way.  If the person is struggling to find the right words, gently try to help him or her. How to recognize symptoms of stress Symptoms of  stress in caregivers include:  Feeling frustrated or angry with the person with dementia.  Denying that the person has dementia or that his or her symptoms will not improve.  Feeling hopeless and unappreciated.  Difficulty sleeping.  Difficulty concentrating.  Feeling anxious, irritable, or depressed.  Developing stress-related health problems.  Feeling like you have too little time for your own life. Follow these instructions at home:   Make sure that you and the person you are caring for: ? Get regular sleep. ? Exercise regularly. ? Eat regular, nutritious meals. ? Drink enough fluid to keep your urine clear or pale yellow. ? Take over-the-counter and prescription medicines only as told by your health care providers. ? Attend all scheduled health care appointments.  Join a support group with others who are caregivers.  Ask about respite care resources so that you can have a regular break from the stress of caregiving.  Look for signs of stress in yourself and in the person you are caring for. If you notice signs of stress, take steps to manage it.  Consider any safety risks and take steps to avoid them.  Organize medications in a pill box for each day of the week.  Create a plan to handle any legal or financial matters. Get legal or financial advice if needed.  Keep a calendar in a central location to remind the person of appointments or other activities. Tips for reducing the risk of injury  Keep floors clear of clutter. Remove rugs, magazine racks, and floor lamps.  Keep hallways well lit, especially at night.  Put a handrail and nonslip mat in the bathtub or shower.  Put childproof locks on cabinets that contain dangerous items, such as medicines, alcohol, guns, toxic cleaning items, sharp tools or utensils, matches, and lighters.  Put the locks in places where the person cannot see or reach them easily. This will help ensure that the person does not wander out  of the house and get lost.  Be prepared for emergencies. Keep a list of emergency phone numbers and addresses in a convenient area.  Remove car keys and lock garage doors so that the person does not try to get in the car and drive.  Have the person wear a bracelet that tracks locations and identifies the person as having memory problems. This should be worn at all times for safety. Where to find support: Many individuals and organizations offer support. These include:  Support groups for people with dementia and for caregivers.  Counselors or therapists.  Home health care services.  Adult day care centers. Where to find more information Alzheimer's Association: CapitalMile.co.nz Contact a health care provider if:  The person's health is rapidly getting worse.  You are no longer able to care for the person.  Caring for the person is affecting your physical and emotional health.  The person threatens himself or herself, you, or anyone else. Summary  Dementia is a term used to describe a number of symptoms that affect memory and thinking.  Dementia usually gets worse slowly over time.  Take steps to reduce the person's risk of injury, and to plan for future care.  Caregivers need support, relief from caregiving, and time for their own lives. This information is not intended to replace advice given to you by your health care provider. Make sure you discuss any questions you have with your health care provider. Document Released: 05/02/2016 Document Revised: 05/11/2017 Document Reviewed: 05/02/2016 Elsevier Patient Education  2020 [employer].

## 2019-01-15 NOTE — Progress Notes (Signed)
I reviewed note and agree with plan.   Penni Bombard, MD 01/15/7618, 50:93 AM Certified in Neurology, Neurophysiology and Neuroimaging  [facility] [address] [place], [place] [address] [phone]

## 2019-01-18 ENCOUNTER — Other Ambulatory Visit: Payer: Self-pay | Admitting: Family Medicine

## 2019-01-18 DIAGNOSIS — F03B18 Unspecified dementia, moderate, with other behavioral disturbance: Secondary | ICD-10-CM

## 2019-01-18 DIAGNOSIS — F0391 Unspecified dementia with behavioral disturbance: Secondary | ICD-10-CM

## 2019-01-20 ENCOUNTER — Ambulatory Visit: Payer: Medicare Other | Attending: Family Medicine | Admitting: Family Medicine

## 2019-01-20 ENCOUNTER — Encounter: Payer: Self-pay | Admitting: Family Medicine

## 2019-01-20 ENCOUNTER — Other Ambulatory Visit: Payer: Self-pay

## 2019-01-20 VITALS — BP 138/82 | HR 76 | Temp 98.4°F | Ht 63.0 in | Wt 135.4 lb

## 2019-01-20 DIAGNOSIS — Z1159 Encounter for screening for other viral diseases: Secondary | ICD-10-CM | POA: Diagnosis not present

## 2019-01-20 DIAGNOSIS — F03B18 Unspecified dementia, moderate, with other behavioral disturbance: Secondary | ICD-10-CM

## 2019-01-20 DIAGNOSIS — L259 Unspecified contact dermatitis, unspecified cause: Secondary | ICD-10-CM | POA: Diagnosis not present

## 2019-01-20 DIAGNOSIS — Z111 Encounter for screening for respiratory tuberculosis: Secondary | ICD-10-CM

## 2019-01-20 DIAGNOSIS — Z029 Encounter for administrative examinations, unspecified: Secondary | ICD-10-CM

## 2019-01-20 DIAGNOSIS — E785 Hyperlipidemia, unspecified: Secondary | ICD-10-CM

## 2019-01-20 DIAGNOSIS — F0391 Unspecified dementia with behavioral disturbance: Secondary | ICD-10-CM

## 2019-01-20 MED ORDER — TRIAMCINOLONE ACETONIDE 0.1 % EX CREA: 1.0000 "application " | TOPICAL_CREAM | Freq: Two times a day (BID) | CUTANEOUS | 1 refills | Status: AC

## 2019-01-20 MED ORDER — SIMVASTATIN 40 MG PO TABS: 40.0000 mg | ORAL_TABLET | Freq: Every day | ORAL | 1 refills | Status: AC

## 2019-01-20 MED ORDER — MEMANTINE HCL 10 MG PO TABS: 10.0000 mg | ORAL_TABLET | Freq: Two times a day (BID) | ORAL | 1 refills | Status: AC

## 2019-01-20 NOTE — Progress Notes (Signed)
Patient has rash on arms.  Patient's daughter states that patient has bed bugs.  Patient has needs FL2 form filled out.

## 2019-01-20 NOTE — Patient Instructions (Signed)
Dementia Dementia is a condition that affects the way the brain functions. It often affects memory and thinking. Usually, dementia gets worse with time and cannot be reversed (progressive dementia). There are many types of dementia, including:  Alzheimer's disease. This type is the most common.  Vascular dementia. This type may happen as the result of a stroke.  Lewy body dementia. This type may happen to people who have Parkinson's disease.  Frontotemporal dementia. This type is caused by damage to nerve cells (neurons) in certain parts of the brain. Some people may be affected by more than one type of dementia. This is called mixed dementia. What are the causes? Dementia is caused by damage to cells in the brain. The area of the brain and the types of cells damaged determine the type of dementia. Usually, this damage is irreversible or cannot be undone. Some examples of irreversible causes include:  Conditions that affect the blood vessels of the brain, such as diabetes, heart disease, or blood vessel disease.  Genetic mutations. In some cases, changes in the brain may be caused by another condition and can be reversed or slowed. Some examples of reversible causes include:  Injury to the brain.  Certain medicines.  Infection, such as meningitis.  Metabolic problems, such as vitamin B12 deficiency or thyroid disease.  Pressure on the brain, such as from a tumor or blood clot. What are the signs or symptoms? Symptoms of dementia depend on the type of dementia. Common signs of dementia include problems with remembering, thinking, problem solving, decision making, and communicating. These signs develop slowly or get worse with time. This may include:  Problems remembering things.  Having trouble taking a bath or putting clothes on.  Forgetting appointments.  Forgetting to pay bills.  Difficulty planning and preparing meals.  Having trouble speaking.  Getting lost easily. How  is this diagnosed? This condition is diagnosed by a specialist (neurologist). It is diagnosed based on the history of your symptoms, your medical history, a physical exam, and tests. Tests may include:  Tests to evaluate brain function, such as memory tests, cognitive tests, and other tests.  Lab tests, such as blood or urine tests.  Imaging tests, such as a CT scan, a PET scan, or an MRI.  Genetic testing. This may be done if other family members have a diagnosis of certain types of dementia. Your health care provider will talk with you and your family, friends, or caregivers about your history and symptoms. How is this treated?  Treatment for this condition depends on the cause of the dementia. Progressive dementias, such as Alzheimer's disease, cannot be cured, but there may be treatments that help to manage symptoms. Treatment might involve taking medicines that may help to:  Control the dementia.  Slow down the progression of the dementia.  Manage symptoms. In some cases, treating the cause of your dementia can improve symptoms, reverse symptoms, or slow down how quickly your dementia becomes worse. Your health care provider can direct you to support groups, organizations, and other health care providers who can help with decisions about your care. Follow these instructions at home: Medicines  Take over-the-counter and prescription medicines only as told by your health care provider.  Use a pill organizer or pill reminder to help you manage your medicines.  Avoid taking medicines that can affect thinking, such as pain medicines or sleeping medicines. Lifestyle  Make healthy lifestyle choices. ? Be physically active as told by your health care provider. ? Do   not use any products that contain nicotine or tobacco, such as cigarettes, e-cigarettes, and chewing tobacco. If you need help quitting, ask your health care provider. ? Do not drink alcohol. ? Practice stress-management  techniques when you get stressed. ? Spend time with other people.  Make sure to get quality sleep. These tips can help you get a good night's rest: ? Avoid napping during the day. ? Keep your sleeping area dark and cool. ? Avoid exercising during the few hours before you go to bed. ? Avoid caffeine products in the evening. Eating and drinking  Drink enough fluid to keep your urine pale yellow.  Eat a healthy diet. General instructions   Work with your health care provider to determine what you need help with and what your safety needs are.  Talk with your health care provider about whether it is safe for you to drive.  If you were given a bracelet that identifies you as a person with memory loss or tracks your location, make sure to wear it at all times.  Work with your family to make important decisions, such as advance directives, medical power of attorney, or a living will.  Keep all follow-up visits as told by your health care provider. This is important. Where to find more information  Alzheimer's Association: CapitalMile.co.nz  National Institute on Aging: DVDEnthusiasts.nl  World Health Organization: RoleLink.com.br Contact a health care provider if:  You have any new or worsening symptoms.  You have problems with choking or swallowing. Get help right away if:  You feel depressed or sad, or feel that you want to harm yourself.  Your family members become concerned for your safety. If you ever feel like you may hurt yourself or others, or have thoughts about taking your own life, get help right away. You can go to your nearest emergency department or call:  Your local emergency services (911 in the [place]  A suicide crisis helpline, such as the [place] at 610-732-9607. This is open 24 hours a day. Summary  Dementia is a condition that affects the way the brain functions. Dementia often affects memory and thinking.  Usually,  dementia gets worse with time and cannot be reversed (progressive dementia).  Treatment for this condition depends on the cause of the dementia.  Work with your health care provider to determine what you need help with and what your safety needs are.  Your health care provider can direct you to support groups, organizations, and other health care providers who can help with decisions about your care. This information is not intended to replace advice given to you by your health care provider. Make sure you discuss any questions you have with your health care provider. Document Released: 11/22/2000 Document Revised: 08/13/2018 Document Reviewed: 08/13/2018 Elsevier Patient Education  2020 [employer].
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
## 2019-01-22 LAB — NOVEL CORONAVIRUS, NAA: SARS-CoV-2, NAA: NOT DETECTED

## 2019-01-22 LAB — TB SKIN TEST
Induration: 0 mm
TB Skin Test: NEGATIVE

## 2019-01-23 ENCOUNTER — Telehealth: Payer: Self-pay

## 2019-01-23 NOTE — Telephone Encounter (Signed)
Patient's daughter has seen results on mychart.

## 2019-01-23 NOTE — Telephone Encounter (Signed)
-----   Message from Charlott Rakes, MD sent at 01/22/2019  1:20 PM EDT ----- COVID-19 test is negative

## 2019-01-23 NOTE — Telephone Encounter (Addendum)
Daughter checking status of provider faxing FL2 forms to [facility]. Daughter ph [phone]. Also fax TB skin test results to facility.  Daughter states tomorrow morning appt with facility and must have FL2 form there.

## 2019-01-23 NOTE — Telephone Encounter (Signed)
Patient's daughte r was called and informed that FL2 form has been sent to th requested facility and the tb and covid results will be faxed over as well.

## 2019-01-31 ENCOUNTER — Telehealth: Payer: Self-pay | Admitting: Licensed Clinical Social Worker

## 2019-01-31 NOTE — Telephone Encounter (Signed)
Call placed to Rhonda Peterson regarding [name] paperwork. LCSW introduced self and explained role at [facility]. Rhonda Peterson was knowledgeable of Legal Aid referral and stated that she has yet to be contacted. States she has made multiple attempts; however, no one has returned calls.   LCSW will follow up with Legal Aid next week. No additional concerns noted.

## 2019-02-03 ENCOUNTER — Ambulatory Visit: Payer: Medicare Other

## 2019-02-03 DIAGNOSIS — E785 Hyperlipidemia, unspecified: Secondary | ICD-10-CM | POA: Diagnosis not present

## 2019-02-03 DIAGNOSIS — Z9183 Wandering in diseases classified elsewhere: Secondary | ICD-10-CM | POA: Diagnosis not present

## 2019-02-12 DIAGNOSIS — E785 Hyperlipidemia, unspecified: Secondary | ICD-10-CM | POA: Diagnosis not present

## 2019-02-12 DIAGNOSIS — D649 Anemia, unspecified: Secondary | ICD-10-CM | POA: Diagnosis not present

## 2019-02-12 DIAGNOSIS — E039 Hypothyroidism, unspecified: Secondary | ICD-10-CM | POA: Diagnosis not present

## 2019-02-18 DIAGNOSIS — H40033 Anatomical narrow angle, bilateral: Secondary | ICD-10-CM | POA: Diagnosis not present

## 2019-02-18 DIAGNOSIS — H2513 Age-related nuclear cataract, bilateral: Secondary | ICD-10-CM | POA: Diagnosis not present

## 2019-02-18 DIAGNOSIS — Z9183 Wandering in diseases classified elsewhere: Secondary | ICD-10-CM | POA: Diagnosis not present

## 2019-02-23 DIAGNOSIS — H5213 Myopia, bilateral: Secondary | ICD-10-CM | POA: Diagnosis not present

## 2019-03-12 DIAGNOSIS — M79674 Pain in right toe(s): Secondary | ICD-10-CM | POA: Diagnosis not present

## 2019-03-12 DIAGNOSIS — B351 Tinea unguium: Secondary | ICD-10-CM | POA: Diagnosis not present

## 2019-03-12 DIAGNOSIS — M79675 Pain in left toe(s): Secondary | ICD-10-CM | POA: Diagnosis not present

## 2019-04-21 DIAGNOSIS — Z9183 Wandering in diseases classified elsewhere: Secondary | ICD-10-CM | POA: Diagnosis not present

## 2019-04-28 DIAGNOSIS — Z9183 Wandering in diseases classified elsewhere: Secondary | ICD-10-CM | POA: Diagnosis not present

## 2019-05-14 DIAGNOSIS — B351 Tinea unguium: Secondary | ICD-10-CM | POA: Diagnosis not present

## 2019-05-14 DIAGNOSIS — M79674 Pain in right toe(s): Secondary | ICD-10-CM | POA: Diagnosis not present

## 2019-05-14 DIAGNOSIS — M79675 Pain in left toe(s): Secondary | ICD-10-CM | POA: Diagnosis not present

## 2019-05-19 DIAGNOSIS — R296 Repeated falls: Secondary | ICD-10-CM | POA: Diagnosis not present

## 2019-05-19 DIAGNOSIS — Z9183 Wandering in diseases classified elsewhere: Secondary | ICD-10-CM | POA: Diagnosis not present

## 2019-08-18 DIAGNOSIS — Z9183 Wandering in diseases classified elsewhere: Secondary | ICD-10-CM | POA: Diagnosis not present

## 2019-08-26 ENCOUNTER — Other Ambulatory Visit: Payer: Self-pay

## 2019-08-26 NOTE — Patient Outreach (Signed)
[facility]) Care Management  08/26/2019  ALISYN TACURI 1950-09-24 [id number]   Medication Adherence call to Mrs. Carolann Lafont HIPPA Compliant Voice message left with a call back number. Mrs. Neimeyer is showing past due on Simvastatin 40 mg under [place].   [place] Management Direct Dial [phone]  Fax [phone] [email or web address]

## 2019-09-04 DIAGNOSIS — D649 Anemia, unspecified: Secondary | ICD-10-CM | POA: Diagnosis not present

## 2019-09-04 DIAGNOSIS — E119 Type 2 diabetes mellitus without complications: Secondary | ICD-10-CM | POA: Diagnosis not present

## 2019-09-04 DIAGNOSIS — Z5181 Encounter for therapeutic drug level monitoring: Secondary | ICD-10-CM | POA: Diagnosis not present

## 2019-09-04 DIAGNOSIS — E785 Hyperlipidemia, unspecified: Secondary | ICD-10-CM | POA: Diagnosis not present

## 2019-12-16 DIAGNOSIS — Z79899 Other long term (current) drug therapy: Secondary | ICD-10-CM | POA: Diagnosis not present

## 2019-12-16 DIAGNOSIS — E7800 Pure hypercholesterolemia, unspecified: Secondary | ICD-10-CM | POA: Diagnosis not present

## 2019-12-16 DIAGNOSIS — Z9183 Wandering in diseases classified elsewhere: Secondary | ICD-10-CM | POA: Diagnosis not present

## 2019-12-24 DIAGNOSIS — R569 Unspecified convulsions: Secondary | ICD-10-CM | POA: Diagnosis not present

## 2019-12-24 DIAGNOSIS — D649 Anemia, unspecified: Secondary | ICD-10-CM | POA: Diagnosis not present

## 2019-12-24 DIAGNOSIS — Z79899 Other long term (current) drug therapy: Secondary | ICD-10-CM | POA: Diagnosis not present

## 2019-12-24 DIAGNOSIS — E7800 Pure hypercholesterolemia, unspecified: Secondary | ICD-10-CM | POA: Diagnosis not present

## 2019-12-24 DIAGNOSIS — E039 Hypothyroidism, unspecified: Secondary | ICD-10-CM | POA: Diagnosis not present

## 2019-12-24 DIAGNOSIS — E785 Hyperlipidemia, unspecified: Secondary | ICD-10-CM | POA: Diagnosis not present

## 2019-12-29 DIAGNOSIS — Z9183 Wandering in diseases classified elsewhere: Secondary | ICD-10-CM | POA: Diagnosis not present

## 2019-12-29 DIAGNOSIS — Z79899 Other long term (current) drug therapy: Secondary | ICD-10-CM | POA: Diagnosis not present

## 2019-12-29 DIAGNOSIS — R300 Dysuria: Secondary | ICD-10-CM | POA: Diagnosis not present

## 2019-12-31 DIAGNOSIS — R300 Dysuria: Secondary | ICD-10-CM | POA: Diagnosis not present

## 2019-12-31 DIAGNOSIS — R319 Hematuria, unspecified: Secondary | ICD-10-CM | POA: Diagnosis not present

## 2019-12-31 DIAGNOSIS — N390 Urinary tract infection, site not specified: Secondary | ICD-10-CM | POA: Diagnosis not present

## 2020-01-12 DIAGNOSIS — L303 Infective dermatitis: Secondary | ICD-10-CM | POA: Diagnosis not present

## 2020-02-02 ENCOUNTER — Telehealth: Payer: Self-pay | Admitting: Family Medicine

## 2020-02-02 DIAGNOSIS — K5900 Constipation, unspecified: Secondary | ICD-10-CM | POA: Diagnosis not present

## 2020-02-02 NOTE — Telephone Encounter (Signed)
Will route to PCP for review. 

## 2020-02-02 NOTE — Telephone Encounter (Signed)
Zara Council, Patient's daughter is calling to let the office know that the patient in an memory care facilty. CB- [phone]

## 2020-02-04 DIAGNOSIS — R109 Unspecified abdominal pain: Secondary | ICD-10-CM | POA: Diagnosis not present

## 2020-02-17 DIAGNOSIS — R140 Abdominal distension (gaseous): Secondary | ICD-10-CM | POA: Diagnosis not present

## 2020-02-17 DIAGNOSIS — Z9183 Wandering in diseases classified elsewhere: Secondary | ICD-10-CM | POA: Diagnosis not present

## 2020-02-19 DIAGNOSIS — R109 Unspecified abdominal pain: Secondary | ICD-10-CM | POA: Diagnosis not present

## 2020-03-01 DIAGNOSIS — Z9183 Wandering in diseases classified elsewhere: Secondary | ICD-10-CM | POA: Diagnosis not present

## 2020-03-01 DIAGNOSIS — R82998 Other abnormal findings in urine: Secondary | ICD-10-CM | POA: Diagnosis not present

## 2020-03-04 ENCOUNTER — Encounter (HOSPITAL_COMMUNITY): Payer: Self-pay

## 2020-03-04 ENCOUNTER — Emergency Department (HOSPITAL_COMMUNITY): Payer: Medicare Other

## 2020-03-04 ENCOUNTER — Emergency Department (HOSPITAL_COMMUNITY)
Admission: EM | Admit: 2020-03-04 | Discharge: 2020-03-04 | Disposition: A | Discharge status: Home / Self Care | Payer: Medicare Other | Attending: Emergency Medicine | Admitting: Emergency Medicine

## 2020-03-04 DIAGNOSIS — R410 Disorientation, unspecified: Secondary | ICD-10-CM | POA: Diagnosis not present

## 2020-03-04 DIAGNOSIS — F0390 Unspecified dementia without behavioral disturbance: Secondary | ICD-10-CM | POA: Insufficient documentation

## 2020-03-04 DIAGNOSIS — R9431 Abnormal electrocardiogram [ECG] [EKG]: Secondary | ICD-10-CM | POA: Diagnosis not present

## 2020-03-04 DIAGNOSIS — R109 Unspecified abdominal pain: Secondary | ICD-10-CM | POA: Diagnosis not present

## 2020-03-04 DIAGNOSIS — C482 Malignant neoplasm of peritoneum, unspecified: Secondary | ICD-10-CM | POA: Insufficient documentation

## 2020-03-04 DIAGNOSIS — C787 Secondary malignant neoplasm of liver and intrahepatic bile duct: Secondary | ICD-10-CM | POA: Diagnosis not present

## 2020-03-04 DIAGNOSIS — Z20822 Contact with and (suspected) exposure to covid-19: Secondary | ICD-10-CM | POA: Diagnosis not present

## 2020-03-04 DIAGNOSIS — R05 Cough: Secondary | ICD-10-CM | POA: Diagnosis not present

## 2020-03-04 DIAGNOSIS — H1031 Unspecified acute conjunctivitis, right eye: Secondary | ICD-10-CM

## 2020-03-04 DIAGNOSIS — C786 Secondary malignant neoplasm of retroperitoneum and peritoneum: Secondary | ICD-10-CM

## 2020-03-04 DIAGNOSIS — H1033 Unspecified acute conjunctivitis, bilateral: Secondary | ICD-10-CM | POA: Insufficient documentation

## 2020-03-04 DIAGNOSIS — R140 Abdominal distension (gaseous): Secondary | ICD-10-CM | POA: Diagnosis not present

## 2020-03-04 DIAGNOSIS — I700 Atherosclerosis of aorta: Secondary | ICD-10-CM | POA: Diagnosis not present

## 2020-03-04 DIAGNOSIS — Z743 Need for continuous supervision: Secondary | ICD-10-CM | POA: Diagnosis not present

## 2020-03-04 LAB — URINALYSIS, ROUTINE W REFLEX MICROSCOPIC
Bacteria, UA: NONE SEEN
Bilirubin Urine: NEGATIVE
Glucose, UA: NEGATIVE mg/dL
Hgb urine dipstick: NEGATIVE
Ketones, ur: NEGATIVE mg/dL
Leukocytes,Ua: NEGATIVE
Nitrite: NEGATIVE
Protein, ur: 30 mg/dL — AB
Specific Gravity, Urine: 1.032 — ABNORMAL HIGH (ref 1.005–1.030)
pH: 5 (ref 5.0–8.0)

## 2020-03-04 LAB — COMPREHENSIVE METABOLIC PANEL
ALT: 20 U/L (ref 0–44)
AST: 32 U/L (ref 15–41)
Albumin: 3.4 g/dL — ABNORMAL LOW (ref 3.5–5.0)
Alkaline Phosphatase: 45 U/L (ref 38–126)
Anion gap: 11 (ref 5–15)
BUN: 17 mg/dL (ref 8–23)
CO2: 30 mmol/L (ref 22–32)
Calcium: 8.4 mg/dL — ABNORMAL LOW (ref 8.9–10.3)
Chloride: 102 mmol/L (ref 98–111)
Creatinine, Ser: 0.66 mg/dL (ref 0.44–1.00)
GFR calc Af Amer: >60 mL/min (ref >60)
GFR calc non Af Amer: >60 mL/min (ref >60)
Glucose, Bld: 100 mg/dL — ABNORMAL HIGH (ref 70–99)
Potassium: 3.6 mmol/L (ref 3.5–5.1)
Sodium: 143 mmol/L (ref 135–145)
Total Bilirubin: 0.3 mg/dL (ref 0.3–1.2)
Total Protein: 7.9 g/dL (ref 6.5–8.1)

## 2020-03-04 LAB — CBC WITH DIFFERENTIAL/PLATELET
Abs Immature Granulocytes: 0.02 K/uL (ref 0.00–0.07)
Basophils Absolute: 0 K/uL (ref 0.0–0.1)
Basophils Relative: 1 %
Eosinophils Absolute: 0.2 K/uL (ref 0.0–0.5)
Eosinophils Relative: 4 %
HCT: 33.5 % — ABNORMAL LOW (ref 36.0–46.0)
Hemoglobin: 10.1 g/dL — ABNORMAL LOW (ref 12.0–15.0)
Immature Granulocytes: 0 %
Lymphocytes Relative: 19 %
Lymphs Abs: 0.9 K/uL (ref 0.7–4.0)
MCH: 27.4 pg (ref 26.0–34.0)
MCHC: 30.1 g/dL (ref 30.0–36.0)
MCV: 91 fL (ref 80.0–100.0)
Monocytes Absolute: 0.5 K/uL (ref 0.1–1.0)
Monocytes Relative: 10 %
Neutro Abs: 3.2 K/uL (ref 1.7–7.7)
Neutrophils Relative %: 66 %
Platelets: 355 K/uL (ref 150–400)
RBC: 3.68 MIL/uL — ABNORMAL LOW (ref 3.87–5.11)
RDW: 14.2 % (ref 11.5–15.5)
WBC: 4.7 K/uL (ref 4.0–10.5)
nRBC: 0 % (ref 0.0–0.2)

## 2020-03-04 LAB — LIPASE, BLOOD: Lipase: 28 U/L (ref 11–51)

## 2020-03-04 LAB — RESPIRATORY PANEL BY RT PCR (FLU A&B, COVID)
Influenza A by PCR: NEGATIVE
Influenza B by PCR: NEGATIVE
SARS Coronavirus 2 by RT PCR: NEGATIVE

## 2020-03-04 MED ORDER — IOHEXOL 300 MG/ML  SOLN
100.0000 mL | Freq: Once | INTRAMUSCULAR | Status: AC | PRN
  Administered 2020-03-04: 100 mL via INTRAVENOUS

## 2020-03-04 MED ORDER — ERYTHROMYCIN 5 MG/GM OP OINT: TOPICAL_OINTMENT | OPHTHALMIC | 0 refills | Status: AC

## 2020-03-04 MED ORDER — ERYTHROMYCIN 5 MG/GM OP OINT
TOPICAL_OINTMENT | Freq: Once | OPHTHALMIC | Status: AC
  Administered 2020-03-04: 1 via OPHTHALMIC
  Filled 2020-03-04: qty 3.5
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
## 2020-03-04 NOTE — Discharge Instructions (Addendum)
The labs did not show any significant findings today.  The chest x-ray did not show any significant infection  Covid test was negative  Urinalysis was negative for infection  CT scan did show likely metastatic cancer which is likely the cause of why she had pain earlier today.  We have consulted with the gynecological oncologist.  They will call tomorrow to schedule an appointment with the daughter, Ms. Gaspar Bidding.

## 2020-03-04 NOTE — ED Notes (Signed)
Pt placed on purewick 

## 2020-03-04 NOTE — ED Triage Notes (Signed)
Pt brought in from [facility] due to abdominal pain and distention x 1 week. Right eye has slight greenish yellow discharge. Pt has Hx of dementia, last BM this morning.   BP 112/68 HR 98 R 16 SPO2 97 RA CBG 119  T 97.9

## 2020-03-04 NOTE — ED Notes (Signed)
Attempted to contact [facility], no answer x2

## 2020-03-04 NOTE — ED Notes (Signed)
[facility] contacted, transportation has been arranged

## 2020-03-05 ENCOUNTER — Telehealth: Payer: Self-pay | Admitting: *Deleted

## 2020-03-05 ENCOUNTER — Telehealth: Payer: Self-pay | Admitting: Oncology

## 2020-03-05 NOTE — Telephone Encounter (Signed)
Introductory call to Rhonda Peterson (daughter).  Explained my role as GYN Nurse Navigator and scheduled new patient appointment with Dr. Alvy Bimler on 03/10/20 at 12:00 (11:30 arrival).  She will attend the appointment with her mother and will arrange transportaton through [facility].

## 2020-03-05 NOTE — Telephone Encounter (Signed)
Called and spoke with the patient's daughter and scheduled a new patient appt for Tuesday September 28 th at 9 am. Gave the address and phone number to the clinic. Also gave the policy for mask and visitors

## 2020-03-06 LAB — URINE CULTURE: Culture: NO GROWTH

## 2020-03-08 ENCOUNTER — Encounter: Payer: Self-pay | Admitting: Gynecologic Oncology

## 2020-03-08 ENCOUNTER — Telehealth: Payer: Self-pay

## 2020-03-08 NOTE — Telephone Encounter (Signed)
Spoke to daughter to confirm upcoming appointment details.  Daughter requested I speak to [facility] staff for information.

## 2020-03-09 ENCOUNTER — Inpatient Hospital Stay: Payer: Medicare Other | Attending: Gynecologic Oncology

## 2020-03-09 ENCOUNTER — Encounter: Payer: Self-pay | Admitting: Oncology

## 2020-03-09 ENCOUNTER — Inpatient Hospital Stay (HOSPITAL_BASED_OUTPATIENT_CLINIC_OR_DEPARTMENT_OTHER): Payer: Medicare Other | Admitting: Gynecologic Oncology

## 2020-03-09 ENCOUNTER — Encounter: Payer: Self-pay | Admitting: Gynecologic Oncology

## 2020-03-09 ENCOUNTER — Other Ambulatory Visit: Payer: Self-pay

## 2020-03-09 VITALS — BP 108/50 | HR 91 | Temp 97.6°F | Resp 16 | Ht 63.0 in | Wt 153.9 lb

## 2020-03-09 DIAGNOSIS — K429 Umbilical hernia without obstruction or gangrene: Secondary | ICD-10-CM | POA: Diagnosis not present

## 2020-03-09 DIAGNOSIS — F0391 Unspecified dementia with behavioral disturbance: Secondary | ICD-10-CM | POA: Insufficient documentation

## 2020-03-09 DIAGNOSIS — C786 Secondary malignant neoplasm of retroperitoneum and peritoneum: Secondary | ICD-10-CM

## 2020-03-09 DIAGNOSIS — J90 Pleural effusion, not elsewhere classified: Secondary | ICD-10-CM | POA: Insufficient documentation

## 2020-03-09 DIAGNOSIS — K5900 Constipation, unspecified: Secondary | ICD-10-CM | POA: Diagnosis not present

## 2020-03-09 DIAGNOSIS — C800 Disseminated malignant neoplasm, unspecified: Secondary | ICD-10-CM

## 2020-03-09 DIAGNOSIS — E7800 Pure hypercholesterolemia, unspecified: Secondary | ICD-10-CM | POA: Insufficient documentation

## 2020-03-09 DIAGNOSIS — R188 Other ascites: Secondary | ICD-10-CM | POA: Diagnosis not present

## 2020-03-09 DIAGNOSIS — C801 Malignant (primary) neoplasm, unspecified: Secondary | ICD-10-CM | POA: Insufficient documentation

## 2020-03-09 DIAGNOSIS — D398 Neoplasm of uncertain behavior of other specified female genital organs: Secondary | ICD-10-CM | POA: Insufficient documentation

## 2020-03-09 DIAGNOSIS — G893 Neoplasm related pain (acute) (chronic): Secondary | ICD-10-CM | POA: Insufficient documentation

## 2020-03-09 LAB — CEA (IN HOUSE-CHCC): CEA (CHCC-In House): 336 ng/mL — ABNORMAL HIGH (ref 0.00–5.00)
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
## 2020-03-09 NOTE — Progress Notes (Signed)
Met with Rhonda Peterson and her daughter Lillard Anes.  Discussed upcoming appointments to see Dr. Alvy Bimler and for paracentesis.  Provided them with my business card and encouraged them to call with any questions or concerns.

## 2020-03-09 NOTE — Patient Instructions (Signed)
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  There is fluid called ascites in the abdomen that has floating cancer cells in it. There are nodules throughout the space in between the intestines and there is fluid in the chest compartment (outside of the lung) that likely has cancer cells floating in it. There is an enlarged right ovary.  The likely source of the cancer is the right ovary, but it is no longer just in the right ovary (it has spread through the abdomen and chest) which means that it is stage 4 and incurable.  Treatment options include:  1/Chemotherapy (if Dr Alvy Bimler feels that Ms [place] is a safe candidate for this) 2/ hospice/comfort care (supportive care). This is often chosen when patients have incurable cancer and also have a progressive incurable disease such as dementia.   Next steps:   1/ paracentesis (drawing off the fluid from the abdomen with a needle to test it for the type of cancer). 2/ appointment with the chemotherapy doctor (medical oncologist) called Dr Alvy Bimler to discuss if chemotherapy is appropriate, and what that would involve.  It is important that you and your family have discussions about what treatments you would and would not want Ms Kaufman to have, what your goals of care are, and whether you would want her to have interventions like ventilator machines and CPR if her heart was to stop beating.

## 2020-03-10 ENCOUNTER — Other Ambulatory Visit: Payer: Self-pay

## 2020-03-10 ENCOUNTER — Telehealth: Payer: Self-pay | Admitting: Hematology and Oncology

## 2020-03-10 ENCOUNTER — Inpatient Hospital Stay (HOSPITAL_BASED_OUTPATIENT_CLINIC_OR_DEPARTMENT_OTHER): Payer: Medicare Other | Admitting: Hematology and Oncology

## 2020-03-10 ENCOUNTER — Encounter: Payer: Self-pay | Admitting: Hematology and Oncology

## 2020-03-10 ENCOUNTER — Ambulatory Visit (HOSPITAL_COMMUNITY)
Admission: RE | Admit: 2020-03-10 | Discharge: 2020-03-10 | Disposition: A | Discharge status: Home / Self Care | Payer: Medicare Other | Source: Ambulatory Visit | Attending: Gynecologic Oncology | Admitting: Gynecologic Oncology

## 2020-03-10 DIAGNOSIS — C786 Secondary malignant neoplasm of retroperitoneum and peritoneum: Secondary | ICD-10-CM | POA: Diagnosis not present

## 2020-03-10 DIAGNOSIS — Z7189 Other specified counseling: Secondary | ICD-10-CM

## 2020-03-10 DIAGNOSIS — D490 Neoplasm of unspecified behavior of digestive system: Secondary | ICD-10-CM | POA: Diagnosis not present

## 2020-03-10 DIAGNOSIS — G893 Neoplasm related pain (acute) (chronic): Secondary | ICD-10-CM | POA: Diagnosis not present

## 2020-03-10 DIAGNOSIS — F0391 Unspecified dementia with behavioral disturbance: Secondary | ICD-10-CM | POA: Diagnosis not present

## 2020-03-10 DIAGNOSIS — J90 Pleural effusion, not elsewhere classified: Secondary | ICD-10-CM | POA: Diagnosis not present

## 2020-03-10 DIAGNOSIS — C762 Malignant neoplasm of abdomen: Secondary | ICD-10-CM

## 2020-03-10 DIAGNOSIS — C800 Disseminated malignant neoplasm, unspecified: Secondary | ICD-10-CM | POA: Insufficient documentation

## 2020-03-10 DIAGNOSIS — R188 Other ascites: Secondary | ICD-10-CM | POA: Diagnosis not present

## 2020-03-10 DIAGNOSIS — E7800 Pure hypercholesterolemia, unspecified: Secondary | ICD-10-CM | POA: Diagnosis not present

## 2020-03-10 DIAGNOSIS — C189 Malignant neoplasm of colon, unspecified: Secondary | ICD-10-CM | POA: Insufficient documentation

## 2020-03-10 DIAGNOSIS — F03B18 Unspecified dementia, moderate, with other behavioral disturbance: Secondary | ICD-10-CM

## 2020-03-10 DIAGNOSIS — K429 Umbilical hernia without obstruction or gangrene: Secondary | ICD-10-CM | POA: Diagnosis not present

## 2020-03-10 DIAGNOSIS — C801 Malignant (primary) neoplasm, unspecified: Secondary | ICD-10-CM | POA: Diagnosis not present

## 2020-03-10 LAB — CA 125: Cancer Antigen (CA) 125: 179 U/mL — ABNORMAL HIGH (ref 0.0–38.1)

## 2020-03-10 MED ORDER — OXYCODONE HCL 5 MG PO TABS
ORAL_TABLET | ORAL | Status: AC
  Filled 2020-03-10: qty 1

## 2020-03-10 MED ORDER — OXYCODONE HCL 5 MG PO TABS
5.0000 mg | ORAL_TABLET | Freq: Once | ORAL | Status: AC
  Administered 2020-03-10: 5 mg via ORAL

## 2020-03-10 MED ORDER — LIDOCAINE HCL 1 % IJ SOLN
INTRAMUSCULAR | Status: AC
  Filled 2020-03-10: qty 20

## 2020-03-10 NOTE — Assessment & Plan Note (Signed)
The cause of the abdominal carcinomatosis is unknown, likely due to GYN primary or GI primary She will undergo diagnostic and therapeutic paracentesis It is possible that that is not sufficient to yield enough clinical information to make an accurate diagnosis Regardless of the final results of the cytology, given the patient's severe dementia, she is not a candidate for systemic chemotherapy Whether this is GYN or GI primary cancer, there is no easy treatment that could be given without major side effects I tried my best to explain to both of her daughters about the rationale of not pursuing palliative chemotherapy and to focus on comfort measures I will set up follow-up appointment next week to review test results and to assess whether she would need another paracentesis

## 2020-03-10 NOTE — Assessment & Plan Note (Signed)
We focused our discussion around goals of care Her daughters appear to be uncomfortable with the idea of not prescribing palliative chemotherapy I tried my best to explain to them the rationale of not pursuing palliative chemotherapy because the toxicity outweighs the benefit With their permission, I spoke with the executive director, Craig Guess who is responsible for her care in the facility at [phone] for further discussion about the plan of care I will call her next week to update her

## 2020-03-10 NOTE — Procedures (Signed)
Ultrasound-guided diagnostic and therapeutic paracentesis performed yielding 3.5 liters of yellow fluid. No immediate complications. A portion of the fluid was sent to the lab for preordered studies. EBL none.

## 2020-03-10 NOTE — Assessment & Plan Note (Signed)
She has moderate dementia, and unable to verbalize her needs I do not believe there is safe way to give the patient chemotherapy without significant major risk

## 2020-03-10 NOTE — Assessment & Plan Note (Signed)
The patient kept rubbing her abdomen and based on her facial expression, I believe she was having some pain With the patient's daughter's permission, we gave her 5 mg of oxycodone in the office I spoke with the caregivers in the facility that she lives in and recommend palliative care/hospice consult and evaluation by the residential physician next week for pain management

## 2020-03-10 NOTE — Progress Notes (Signed)
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[place]) The cause of the abdominal carcinomatosis is unknown, likely due to GYN primary or GI primary She will undergo diagnostic and therapeutic paracentesis It is possible that that is not sufficient to yield enough clinical information to make an accurate diagnosis Regardless of the final results of the cytology, given the patient's severe dementia, she is not a candidate for systemic chemotherapy Whether this is GYN or GI primary cancer, there is no easy treatment that could be given without major side effects I tried my best to explain to both of her daughters about the rationale of not pursuing palliative chemotherapy and to focus on comfort measures I will set up follow-up appointment next week to review test results and to assess whether she would need another paracentesis  Moderate dementia with behavioral disturbance ([place]) She has moderate dementia, and unable to verbalize her needs I do not believe there is safe way to give the patient chemotherapy without significant major risk  Cancer associated pain The patient kept rubbing her abdomen and based on her facial expression, I believe she was having some pain With the patient's daughter's permission, we gave her 5 mg of oxycodone in the office I spoke with the caregivers in the facility that she lives in and recommend palliative care/hospice consult and evaluation by the residential physician next week for pain management   Goals of care, counseling/discussion We focused our discussion around goals of care Her daughters appear to be uncomfortable with the idea of not prescribing palliative chemotherapy I tried my best to explain to them the rationale of not pursuing palliative chemotherapy because the toxicity outweighs the benefit With their permission, I spoke with the  executive director, Craig Guess who is responsible for her care in the facility at [phone] for further discussion about the plan of care I will call her next week to update her   No orders of the defined types were placed in this encounter.   The total time spent in the appointment was 60 minutes encounter with patients including review of chart and various tests results, discussions about plan of care and coordination of care plan   All questions were answered. The patient knows to call the clinic with any problems, questions or concerns. No barriers to learning was detected.  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 female is here because of abnormal imaging studies, suspicious for malignancy The patient has moderate dementia She is here accompanied by her 2 daughters, Avanell Shackleton and [place] I also spoke with her caregiver in the facility The patient is not able to have a meaningful discussion.  She appears to have some pain and frequently rubs on her abdomen Her abdomen appears swollen/distended with ascites She was noted to have progressive abdominal distention and was sent to the emergency department recently for evaluation CT imaging on 03/04/2020 showed  1. 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 Peripherally calcified 5.5 cm right adnexal lesion concerning for the primary ovarian neoplasm. 2. Moderate to large simple free fluid ascites. 3. 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 4. Bilateral trace pleural effusions. 5.  Aortic Atherosclerosis (ICD10-I70.0). 6.  No bowel obstruction.  She was subsequently discharged back to memory care facility with appointment to oncology  department She was seen by GYN surgeon who arrange for diagnostic and therapeutic paracentesis today In addition to abdominal distention, she was also found to have bilateral lower extremity edema According to family, she has good appetite There were no reported abnormal vaginal bleeding or changes in bowel habits  MEDICAL HISTORY:  Past Medical History:  Diagnosis Date  . Concussion   . Dementia ([place])   . Dementia ([place])   . Depression   . Headache(784.0)   . Hypercholesteremia   . Memory loss   . Migraine     SURGICAL HISTORY: Past Surgical History:  Procedure Laterality Date  . BACK SURGERY    . HAND SURGERY Bilateral   . KNEE SURGERY      SOCIAL HISTORY: Social History   Socioeconomic History  . Marital status: Married    Spouse name: Laverna Peace  . Number of children: 3  . Years of education: 41  . Highest education level: Not on file  Occupational History    Comment: na  Tobacco Use  . Smoking status: Never Smoker  . Smokeless tobacco: Never Used  Vaping Use  . Vaping Use: Never used  Substance and Sexual Activity  . Alcohol use: No  . Drug use: No  . Sexual activity: Not on file  Other Topics Concern  . Not on file  Social History Narrative   Lives at home with husband, Jimmy   Caffeine- sodas, amt varies   Social Determinants of Health   Financial Resource Strain:   . Difficulty of Paying Living Expenses: Not on file  Food Insecurity:   . Worried About Charity fundraiser in the Last Year: Not on file  . Ran Out of Food in the Last Year: Not on file  Transportation Needs:   . Lack of Transportation (Medical): Not on file  . Lack of Transportation (Non-Medical): Not on file  Physical Activity:   . Days of Exercise per Week: Not on file  . Minutes of Exercise per Session: Not on file  Stress:   . Feeling of Stress : Not on file  Social Connections:   . Frequency of Communication with Friends and Family: Not on file  . Frequency of Social Gatherings  with Friends and Family: Not on file  . Attends Religious Services: Not on file  . Active Member of Clubs or Organizations: Not on file  . Attends Archivist Meetings: Not on file  . Marital Status: Not on file  Intimate Partner Violence:   . Fear of Current or Ex-Partner: Not on file  . Emotionally Abused: Not on file  . Physically Abused: Not on file  . Sexually Abused: Not on file    FAMILY HISTORY: History reviewed. No pertinent family history.  ALLERGIES:  has No Known Allergies.  MEDICATIONS:  Current Outpatient Medications  Medication Sig Dispense Refill  . acetaminophen (TYLENOL) 500 MG tablet Take 500 mg by mouth every 4 (four) hours as needed for mild pain, fever or headache. Not to exceed 2000 mg in 24 hours    . alum & mag hydroxide-simeth (MINTOX) 200-200-20 MG/5ML suspension Take 30 mLs by mouth as needed for indigestion or heartburn. Not to exceed 4 doses in 24  hours    . divalproex (DEPAKOTE SPRINKLE) 125 MG capsule Take 375 mg by mouth 2 (two) times daily.    Marland Kitchen erythromycin ophthalmic ointment Place a  1/2 inch ribbon of ointment into the lower eyelid on right 3.5 g 0  . guaiFENesin (TUSSIN) 100 MG/5ML liquid Take 200 mg by mouth 4 (four) times daily as needed for cough. Do not exceed 4 doses in 24 hours.    Marland Kitchen LORazepam (ATIVAN) 0.5 MG tablet Take 0.5 mg by mouth every 8 (eight) hours as needed for anxiety.    . magnesium hydroxide (MILK OF MAGNESIA) 400 MG/5ML suspension Take 30 mLs by mouth at bedtime as needed for mild constipation.    . memantine (NAMENDA) 10 MG tablet Take 1 tablet (10 mg total) by mouth 2 (two) times daily. 180 tablet 1  . Misc. Devices (CANE) MISC Use can to help assist with ambulation and reduce falls 1 each 0  . Neomycin-Bacitracin-Polymyxin (TRIPLE ANTIBIOTIC) [phone] OINT Apply 1 application topically as needed (for minor skin abrasions or tears).    . OLANZapine (ZYPREXA) 5 MG tablet Take 5 mg by mouth at bedtime.    .  sertraline (ZOLOFT) 25 MG tablet Take 25 mg by mouth daily.    . simvastatin (ZOCOR) 40 MG tablet Take 1 tablet (40 mg total) by mouth at bedtime. (Patient taking differently: Take 40 mg by mouth at bedtime. ) 90 tablet 1  . triamcinolone cream (KENALOG) 0.1 % Apply 1 application topically 2 (two) times daily. (Patient not taking: Reported on 03/04/2020) 30 g 1   No current facility-administered medications for this visit.   Facility-Administered Medications Ordered in Other Visits  Medication Dose Route Frequency Provider Last Rate Last Admin  . lidocaine (XYLOCAINE) 1 % (with pres) injection           . lidocaine (XYLOCAINE) 1 % (with pres) injection             REVIEW OF SYSTEMS: Unable to obtain due to her dementia  PHYSICAL EXAMINATION: ECOG PERFORMANCE STATUS: 3 - Symptomatic, >50% confined to bed  Vitals:   03/10/20 1200  BP: (!) 105/59  Pulse: 88  Resp: 18  Temp: 97.7 F (36.5 C)  SpO2: 100%   Filed Weights   03/10/20 1200  Weight: 166 lb 11.2 oz (75.6 kg)    GENERAL:alert, in mild distress and mildly uncomfortable SKIN: skin color, texture, turgor are normal, no rashes or significant lesions EYES: normal, conjunctiva are pink and non-injected, sclera clear OROPHARYNX:no exudate, no erythema and lips, buccal mucosa, and tongue normal  NECK: supple, thyroid normal size, non-tender, without nodularity LYMPH:  no palpable lymphadenopathy in the cervical, axillary or inguinal LUNGS: Reduced breath sound bilaterally with poor respiratory effort HEART: regular rate & rhythm and no murmurs with moderate bilateral lower extremity edema ABDOMEN:abdomen tense, distended with ascites.  Mild tenderness on palpation on the right periumbilical region Musculoskeletal:no cyanosis of digits and no clubbing  PSYCH: alert with garbled speech NEURO: unable to assess  LABORATORY DATA:  I have reviewed the data as listed Lab Results  Component Value Date   WBC 4.7 03/04/2020   HGB  10.1 (L) 03/04/2020   HCT 33.5 (L) 03/04/2020   MCV 91.0 03/04/2020   PLT 355 03/04/2020   Recent Labs    03/04/20 1635  NA 143  K 3.6  CL 102  CO2 30  GLUCOSE 100*  BUN 17  CREATININE 0.66  CALCIUM 8.4*  GFRNONAA >60  GFRAA >60  PROT 7.9  ALBUMIN 3.4*  AST 32  ALT 20  ALKPHOS 45  BILITOT 0.3    RADIOGRAPHIC STUDIES: I have personally reviewed the radiological images  as listed and agreed with the findings in the report. DG Chest 1 View  Result Date: 03/04/2020 CLINICAL DATA:  Cough, dementia, unable to follow instructions inhale deeply and mold. EXAM: CHEST  1 VIEW COMPARISON:  Chest x-ray 08/06/2018 FINDINGS: The heart size and mediastinal contours are within normal limits. Low lung volumes with bibasilar compressive changes. No focal consolidation. No pulmonary edema. No pleural effusion. No pneumothorax. No acute osseous abnormality. IMPRESSION: Low lung volumes with bibasilar compressive changes. Underlying infection/inflammation cannot be fully excluded. Electronically Signed   By: Iven Finn M.D.   On: 03/04/2020 16:28   CT Abdomen Pelvis W Contrast  Result Date: 03/04/2020 CLINICAL DATA:  Abdominal pain, acute nonlocalized. Distended. Concern for bowel obstruction. EXAM: CT ABDOMEN AND PELVIS WITH CONTRAST TECHNIQUE: Multidetector CT imaging of the abdomen and pelvis was performed using the standard protocol following bolus administration of intravenous contrast. CONTRAST:  129mL OMNIPAQUE IOHEXOL 300 MG/ML  SOLN COMPARISON:  None. FINDINGS: Lower chest: Trace bilateral, right greater than left, pleural effusions. Bilateral lower lobe subsegmental atelectasis. Hepatobiliary: Scalloped hepatic contour. Indeterminate 1.5 cm hypodensity within the right hepatic lobe (2:14). Pancreas: Unremarkable. No pancreatic ductal dilatation or surrounding inflammatory changes. Spleen: Irregular and scalloped appearing peripheral splenic contour with several subcapsular lesions. Normal  in size. Adrenals/Urinary Tract: No adrenal nodule bilaterally. Bilateral kidneys enhance and excrete symmetrically. Fluid density lesions within the right kidney measures up to 3 cm and likely represents a simple renal cyst. No hydronephrosis. No hydroureter. The urinary bladder is unremarkable. On delayed imaging, there is no urothelial wall thickening and there are no filling defects in the opacified portions of the bilateral collecting systems or ureters. Stomach/Bowel: Stomach is within normal limits. The appendix is not definitely identified. No evidence of bowel wall thickening, distention, or inflammatory changes. Vascular/Lymphatic: The hepatic, portal, splenic, superior mesenteric veins are patent., no abdominal aorta or iliac aneurysm. Mild atherosclerotic plaque of the aorta and its branches. No abdominal, pelvic, or inguinal lymphadenopathy. Reproductive: Irregular-appearing/scalloped anterior wall of the uterus (5:96). Enlarging globular appearing uterus with a coarsely calcified lesion likely representing a degenerative fibroid. Multiple other hyperdense lesions within the uterus likely represent uterine fibroids. The endometrium is not visualized and likely distorted. The ovaries are not definitely visualized; however, there is a approximately 5.5 x4 cm peripherally calcified lesion within the right adnexal region that is concerning for an ovarian mass (2:58). Other: Moderate to large volume simple free fluid ascites. High-density material is noted within the omentum (2:60). Soft tissue density noted within the ileocolic mesentery (1:69). Soft tissue density along within the mesentery along the left paracolic gutter measuring up to 2.7 cm (2:32). No pneumoperitoneum. Musculoskeletal: Small fluid containing umbilical hernia with an abdominal defect of approximately 1.6 cm. Mild subcutaneus soft tissue edema. No suspicious lytic or blastic osseous lesions. No acute displaced fracture. Multilevel  degenerative changes of the spine. IMPRESSION: 1. 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 Peripherally calcified 5.5 cm right adnexal lesion concerning for the primary ovarian neoplasm. 2. Moderate to large simple free fluid ascites. 3. 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 4. Bilateral trace pleural effusions. 5.  Aortic Atherosclerosis (ICD10-I70.0). 6. No bowel obstruction. These results were called by telephone at the time of interpretation on 03/04/2020 at 6:50 pm to provider [facility] , who verbally acknowledged these results. Electronically Signed   By: Iven Finn M.D.   On: 03/04/2020 18:52  US Paracentesis  Result Date: 03/10/2020 INDICATION: Patient with history of dementia, and recent imaging findings of diffuse peritoneal carcinomatosis, omental caking, ascites, hepatic, splenic and uterine metastases, right adnexal lesion concerning for primary ovarian neoplasm; request received for diagnostic and therapeutic paracentesis. EXAM: ULTRASOUND GUIDED DIAGNOSTIC AND THERAPEUTIC PARACENTESIS MEDICATIONS: 1% lidocaine to skin and subcutaneous tissue COMPLICATIONS: None immediate. PROCEDURE: Informed written consent was obtained from the patient's daughter after a discussion of the risks, benefits and alternatives to treatment. A timeout was performed prior to the initiation of the procedure. Initial ultrasound scanning demonstrates a moderate to large amount of ascites within the left mid to lower abdominal quadrant. The left mid to lower abdomen was prepped and draped in the usual sterile fashion. 1% lidocaine was used for local anesthesia. Following this, a 6 Fr Safe-T-Centesis catheter was introduced. An ultrasound image was saved for documentation purposes. The paracentesis was performed. The catheter was removed and a dressing was applied. The patient  tolerated the procedure well without immediate post procedural complication. FINDINGS: A total of approximately 3.5 liters of yellow fluid was removed. Samples were sent to the laboratory as requested by the clinical team. IMPRESSION: Successful ultrasound-guided diagnostic and therapeutic paracentesis yielding 3.5 liters of peritoneal fluid. Read by: Rowe Robert, PA-C Electronically Signed   By: Aletta Edouard M.D.   On: 03/10/2020 14:40

## 2020-03-10 NOTE — Telephone Encounter (Signed)
Scheduled appointment per 9/29 scheduling message. Patient is aware of appointment. Gave patient updated calendar.

## 2020-03-11 ENCOUNTER — Other Ambulatory Visit: Payer: Self-pay

## 2020-03-12 LAB — CYTOLOGY - NON PAP

## 2020-03-16 ENCOUNTER — Inpatient Hospital Stay: Payer: Medicare Other | Attending: Gynecologic Oncology | Admitting: Hematology and Oncology

## 2020-03-16 ENCOUNTER — Other Ambulatory Visit: Payer: Self-pay

## 2020-03-16 DIAGNOSIS — G893 Neoplasm related pain (acute) (chronic): Secondary | ICD-10-CM | POA: Diagnosis not present

## 2020-03-16 DIAGNOSIS — Z79899 Other long term (current) drug therapy: Secondary | ICD-10-CM | POA: Diagnosis not present

## 2020-03-16 DIAGNOSIS — Z7189 Other specified counseling: Secondary | ICD-10-CM | POA: Diagnosis not present

## 2020-03-16 DIAGNOSIS — F0390 Unspecified dementia without behavioral disturbance: Secondary | ICD-10-CM | POA: Insufficient documentation

## 2020-03-16 DIAGNOSIS — R180 Malignant ascites: Secondary | ICD-10-CM

## 2020-03-16 DIAGNOSIS — C786 Secondary malignant neoplasm of retroperitoneum and peritoneum: Secondary | ICD-10-CM | POA: Diagnosis not present

## 2020-03-16 DIAGNOSIS — R4182 Altered mental status, unspecified: Secondary | ICD-10-CM

## 2020-03-16 DIAGNOSIS — C189 Malignant neoplasm of colon, unspecified: Secondary | ICD-10-CM | POA: Diagnosis not present

## 2020-03-16 DIAGNOSIS — C801 Malignant (primary) neoplasm, unspecified: Secondary | ICD-10-CM | POA: Diagnosis not present

## 2020-03-17 ENCOUNTER — Encounter: Payer: Self-pay | Admitting: Hematology and Oncology

## 2020-03-17 DIAGNOSIS — R180 Malignant ascites: Secondary | ICD-10-CM | POA: Insufficient documentation

## 2020-03-17 DIAGNOSIS — R4182 Altered mental status, unspecified: Secondary | ICD-10-CM | POA: Insufficient documentation

## 2020-03-17 NOTE — Assessment & Plan Note (Signed)
She is noted to have altered mental status with excessive sedation today I have discussed and reviewed the medication with family as well as a care facility I recommend consideration of reducing the dose of Depakote, olanzapine and lorazepam

## 2020-03-17 NOTE — Assessment & Plan Note (Signed)
I have extensive discussion with her 2 daughters today With the patient's daughter's permission, I have also reviewed information from today's visit with the manager in the care facility Overall, the pathology report and her tumor marker suggest that the patient have metastatic colon cancer with peritoneal metastasis rather than primary ovarian cancer I reviewed with the patient's daughters the rationale of not pursuing colonoscopy or surgical opinion We discussed prognosis without treatment The patient is too frail to undergo any form of palliative treatments We have extensive discussions about the role of palliative care and hospice

## 2020-03-17 NOTE — Assessment & Plan Note (Signed)
We have extensive discussions about goals of care and prognosis with or without treatment According to the care facility, hospice program is in place She does not need future follow-up or further work-up.  I have addressed all the questions and concerns by family members and caregivers today

## 2020-03-17 NOTE — Assessment & Plan Note (Signed)
She is noted to have rapid accumulation of peritoneal fluid after recent paracentesis I do not recommend routine paracentesis due to risk of dehydration and other potential complications from paracentesis It would not improve her quality of life The only trigger to order paracentesis in the future would be if she has respiratory distress due to significant abdominal distention Her oxygen saturation today is 100%

## 2020-03-17 NOTE — Progress Notes (Signed)
[place] OFFICE PROGRESS NOTE  Patient Care Team: Patient, No Pcp Per as PCP - General (General Practice)  ASSESSMENT & PLAN:  Metastatic colon cancer in female [facility]) I have extensive discussion with her 2 daughters today With the patient's daughter's permission, I have also reviewed information from today's visit with the manager in the care facility Overall, the pathology report and her tumor marker suggest that the patient have metastatic colon cancer with peritoneal metastasis rather than primary ovarian cancer I reviewed with the patient's daughters the rationale of not pursuing colonoscopy or surgical opinion We discussed prognosis without treatment The patient is too frail to undergo any form of palliative treatments We have extensive discussions about the role of palliative care and hospice   Malignant ascites She is noted to have rapid accumulation of peritoneal fluid after recent paracentesis I do not recommend routine paracentesis due to risk of dehydration and other potential complications from paracentesis It would not improve her quality of life The only trigger to order paracentesis in the future would be if she has respiratory distress due to significant abdominal distention Her oxygen saturation today is 100%  Altered mental status She is noted to have altered mental status with excessive sedation today I have discussed and reviewed the medication with family as well as a care facility I recommend consideration of reducing the dose of Depakote, olanzapine and lorazepam  Cancer associated pain She is not in pain I recommend palliative care/hospice service to manage her pain in the future  Goals of care, counseling/discussion We have extensive discussions about goals of care and prognosis with or without treatment According to the care facility, hospice program is in place She does not need future follow-up or further work-up.  I have addressed all  the questions and concerns by family members and caregivers today   No orders of the defined types were placed in this encounter.   All questions were answered. The patient knows to call the clinic with any problems, questions or concerns. The total time spent in the appointment was 80 minutes encounter with patients including review of chart and various tests results, discussions about plan of care and coordination of care plan   Heath Lark, MD 03/17/2020 9:51 AM  INTERVAL HISTORY: Please see below for problem oriented charting. She returns with her 2 daughters After review of plan of care with the patient and family, received permission to call the manager of the care facility Approximately 3-1/2 L of fluid were removed last week Unfortunately, she is noted to have rapid accumulation of ascites She is not in pain Her last pain medicine was several days ago She is noted to be excessively sedated today but arousable Her blood pressure is noted to be low I have given the family to copies of recent pathology report We review her recent imaging study and test results extensively today  SUMMARY OF ONCOLOGIC HISTORY: Oncology History  Metastatic colon cancer in female [facility])  03/10/2020 Pathology Results   Mucinous neoplastic epithelium is observed and by IHC is positive for MOC31, CK7, CK20 and CDX-2.  PAX 8 and D2-40 are negative. Clinicoradiologic concern for a primary gynecologic neoplasm is noted; however, origin from the gastrointestinal tract, particularly the appendix, warrants strong consideration   03/10/2020 Procedure   Successful ultrasound-guided diagnostic and therapeutic paracentesis yielding 3.5 liters of peritoneal fluid   03/10/2020 Imaging   1. Constellation of findings concerning for ovarian malignancy with diffuse peritoneal carcinomatosis, omental caking, as well  as hepatic, splenic, and uterine metastases. Peripherally calcified 5.5 cm right adnexal lesion concerning  for the primary ovarian neoplasm. 2. Moderate to large simple free fluid ascites. 3. 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 4. Bilateral trace pleural effusions. 5.  Aortic Atherosclerosis (ICD10-I70.0). 6. No bowel obstruction.   03/17/2020 Cancer Staging   Staging form: Colon and Rectum, AJCC 8th Edition - Clinical stage from 03/17/2020: Stage Unknown (cTX, cNX, pM1) - Signed by Heath Lark, MD on 03/17/2020     REVIEW OF SYSTEMS: Unable to obtain I have reviewed the past medical history, past surgical history, social history and family history with the patient and they are unchanged from previous note.  ALLERGIES:  has No Known Allergies.  MEDICATIONS:  Current Outpatient Medications  Medication Sig Dispense Refill  . acetaminophen (TYLENOL) 500 MG tablet Take 500 mg by mouth every 4 (four) hours as needed for mild pain, fever or headache. Not to exceed 2000 mg in 24 hours    . alum & mag hydroxide-simeth (MINTOX) 200-200-20 MG/5ML suspension Take 30 mLs by mouth as needed for indigestion or heartburn. Not to exceed 4 doses in 24  hours    . divalproex (DEPAKOTE SPRINKLE) 125 MG capsule Take 375 mg by mouth 2 (two) times daily.    Marland Kitchen erythromycin ophthalmic ointment Place a 1/2 inch ribbon of ointment into the lower eyelid on right 3.5 g 0  . guaiFENesin (TUSSIN) 100 MG/5ML liquid Take 200 mg by mouth 4 (four) times daily as needed for cough. Do not exceed 4 doses in 24 hours.    Marland Kitchen LORazepam (ATIVAN) 0.5 MG tablet Take 0.5 mg by mouth every 8 (eight) hours as needed for anxiety.    . magnesium hydroxide (MILK OF MAGNESIA) 400 MG/5ML suspension Take 30 mLs by mouth at bedtime as needed for mild constipation.    . memantine (NAMENDA) 10 MG tablet Take 1 tablet (10 mg total) by mouth 2 (two) times daily. 180 tablet 1  . Misc. Devices (CANE) MISC Use can to help assist with ambulation and reduce falls 1 each 0  . Neomycin-Bacitracin-Polymyxin (TRIPLE ANTIBIOTIC) [phone] OINT Apply  1 application topically as needed (for minor skin abrasions or tears).    . OLANZapine (ZYPREXA) 5 MG tablet Take 5 mg by mouth at bedtime.    . sertraline (ZOLOFT) 25 MG tablet Take 25 mg by mouth daily.    . simvastatin (ZOCOR) 40 MG tablet Take 1 tablet (40 mg total) by mouth at bedtime. (Patient taking differently: Take 40 mg by mouth at bedtime. ) 90 tablet 1  . triamcinolone cream (KENALOG) 0.1 % Apply 1 application topically 2 (two) times daily. (Patient not taking: Reported on 03/04/2020) 30 g 1   No current facility-administered medications for this visit.    PHYSICAL EXAMINATION: ECOG PERFORMANCE STATUS: 3 - Symptomatic, >50% confined to bed  Vitals:   03/16/20 1305  BP: (!) 91/54  Pulse: 97  Resp: 18  Temp: (!) 97.2 F (36.2 C)  SpO2: 100%   There were no vitals filed for this visit.  GENERAL: Is noted to be excessively sedated but wakes up intermittently.  She is noted to have gross ascites  LABORATORY DATA:  I have reviewed the data as listed    Component Value Date/Time   NA 143 03/04/2020 1635   NA 144 06/20/2017 1039   K 3.6 03/04/2020 1635   CL 102 03/04/2020 1635   CO2 30 03/04/2020 1635   GLUCOSE 100 (H) 03/04/2020 1635  BUN 17 03/04/2020 1635   BUN 13 06/20/2017 1039   CREATININE 0.66 03/04/2020 1635   CREATININE 0.69 07/12/2016 1029   CALCIUM 8.4 (L) 03/04/2020 1635   PROT 7.9 03/04/2020 1635   PROT 7.1 06/20/2017 1039   ALBUMIN 3.4 (L) 03/04/2020 1635   ALBUMIN 4.2 06/20/2017 1039   AST 32 03/04/2020 1635   ALT 20 03/04/2020 1635   ALKPHOS 45 03/04/2020 1635   BILITOT 0.3 03/04/2020 1635   BILITOT 0.7 06/20/2017 1039   GFRNONAA >60 03/04/2020 1635   GFRNONAA >89 07/12/2016 1029   GFRAA >60 03/04/2020 1635   GFRAA >89 07/12/2016 1029    No results found for: SPEP, UPEP  Lab Results  Component Value Date   WBC 4.7 03/04/2020   NEUTROABS 3.2 03/04/2020   HGB 10.1 (L) 03/04/2020   HCT 33.5 (L) 03/04/2020   MCV 91.0 03/04/2020   PLT 355  03/04/2020      Chemistry      Component Value Date/Time   NA 143 03/04/2020 1635   NA 144 06/20/2017 1039   K 3.6 03/04/2020 1635   CL 102 03/04/2020 1635   CO2 30 03/04/2020 1635   BUN 17 03/04/2020 1635   BUN 13 06/20/2017 1039   CREATININE 0.66 03/04/2020 1635   CREATININE 0.69 07/12/2016 1029      Component Value Date/Time   CALCIUM 8.4 (L) 03/04/2020 1635   ALKPHOS 45 03/04/2020 1635   AST 32 03/04/2020 1635   ALT 20 03/04/2020 1635   BILITOT 0.3 03/04/2020 1635   BILITOT 0.7 06/20/2017 1039       RADIOGRAPHIC STUDIES: I have personally reviewed the radiological images as listed and agreed with the findings in the report. DG Chest 1 View  Result Date: 03/04/2020 CLINICAL DATA:  Cough, dementia, unable to follow instructions inhale deeply and mold. EXAM: CHEST  1 VIEW COMPARISON:  Chest x-ray 08/06/2018 FINDINGS: The heart size and mediastinal contours are within normal limits. Low lung volumes with bibasilar compressive changes. No focal consolidation. No pulmonary edema. No pleural effusion. No pneumothorax. No acute osseous abnormality. IMPRESSION: Low lung volumes with bibasilar compressive changes. Underlying infection/inflammation cannot be fully excluded. Electronically Signed   By: Iven Finn M.D.   On: 03/04/2020 16:28   CT Abdomen Pelvis W Contrast  Result Date: 03/04/2020 CLINICAL DATA:  Abdominal pain, acute nonlocalized. Distended. Concern for bowel obstruction. EXAM: CT ABDOMEN AND PELVIS WITH CONTRAST TECHNIQUE: Multidetector CT imaging of the abdomen and pelvis was performed using the standard protocol following bolus administration of intravenous contrast. CONTRAST:  183mL OMNIPAQUE IOHEXOL 300 MG/ML  SOLN COMPARISON:  None. FINDINGS: Lower chest: Trace bilateral, right greater than left, pleural effusions. Bilateral lower lobe subsegmental atelectasis. Hepatobiliary: Scalloped hepatic contour. Indeterminate 1.5 cm hypodensity within the right hepatic  lobe (2:14). Pancreas: Unremarkable. No pancreatic ductal dilatation or surrounding inflammatory changes. Spleen: Irregular and scalloped appearing peripheral splenic contour with several subcapsular lesions. Normal in size. Adrenals/Urinary Tract: No adrenal nodule bilaterally. Bilateral kidneys enhance and excrete symmetrically. Fluid density lesions within the right kidney measures up to 3 cm and likely represents a simple renal cyst. No hydronephrosis. No hydroureter. The urinary bladder is unremarkable. On delayed imaging, there is no urothelial wall thickening and there are no filling defects in the opacified portions of the bilateral collecting systems or ureters. Stomach/Bowel: Stomach is within normal limits. The appendix is not definitely identified. No evidence of bowel wall thickening, distention, or inflammatory changes. Vascular/Lymphatic: The hepatic, portal, splenic, superior  mesenteric veins are patent., no abdominal aorta or iliac aneurysm. Mild atherosclerotic plaque of the aorta and its branches. No abdominal, pelvic, or inguinal lymphadenopathy. Reproductive: Irregular-appearing/scalloped anterior wall of the uterus (5:96). Enlarging globular appearing uterus with a coarsely calcified lesion likely representing a degenerative fibroid. Multiple other hyperdense lesions within the uterus likely represent uterine fibroids. The endometrium is not visualized and likely distorted. The ovaries are not definitely visualized; however, there is a approximately 5.5 x4 cm peripherally calcified lesion within the right adnexal region that is concerning for an ovarian mass (2:58). Other: Moderate to large volume simple free fluid ascites. High-density material is noted within the omentum (2:60). Soft tissue density noted within the ileocolic mesentery (1:58). Soft tissue density along within the mesentery along the left paracolic gutter measuring up to 2.7 cm (2:32). No pneumoperitoneum. Musculoskeletal:  Small fluid containing umbilical hernia with an abdominal defect of approximately 1.6 cm. Mild subcutaneus soft tissue edema. No suspicious lytic or blastic osseous lesions. No acute displaced fracture. Multilevel degenerative changes of the spine. IMPRESSION: 1. 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 Peripherally calcified 5.5 cm right adnexal lesion concerning for the primary ovarian neoplasm. 2. Moderate to large simple free fluid ascites. 3. 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 4. Bilateral trace pleural effusions. 5.  Aortic Atherosclerosis (ICD10-I70.0). 6. No bowel obstruction. These results were called by telephone at the time of interpretation on 03/04/2020 at 6:50 pm to provider [facility] , who verbally acknowledged these results. Electronically Signed   By: Iven Finn M.D.   On: 03/04/2020 18:52   US Paracentesis  Result Date: 03/10/2020 INDICATION: Patient with history of dementia, and recent imaging findings of diffuse peritoneal carcinomatosis, omental caking, ascites, hepatic, splenic and uterine metastases, right adnexal lesion concerning for primary ovarian neoplasm; request received for diagnostic and therapeutic paracentesis. EXAM: ULTRASOUND GUIDED DIAGNOSTIC AND THERAPEUTIC PARACENTESIS MEDICATIONS: 1% lidocaine to skin and subcutaneous tissue COMPLICATIONS: None immediate. PROCEDURE: Informed written consent was obtained from the patient's daughter after a discussion of the risks, benefits and alternatives to treatment. A timeout was performed prior to the initiation of the procedure. Initial ultrasound scanning demonstrates a moderate to large amount of ascites within the left mid to lower abdominal quadrant. The left mid to lower abdomen was prepped and draped in the usual sterile fashion. 1% lidocaine was used for local anesthesia. Following  this, a 6 Fr Safe-T-Centesis catheter was introduced. An ultrasound image was saved for documentation purposes. The paracentesis was performed. The catheter was removed and a dressing was applied. The patient tolerated the procedure well without immediate post procedural complication. FINDINGS: A total of approximately 3.5 liters of yellow fluid was removed. Samples were sent to the laboratory as requested by the clinical team. IMPRESSION: Successful ultrasound-guided diagnostic and therapeutic paracentesis yielding 3.5 liters of peritoneal fluid. Read by: Rowe Robert, PA-C Electronically Signed   By: Aletta Edouard M.D.   On: 03/10/2020 14:40

## 2020-03-17 NOTE — Assessment & Plan Note (Signed)
She is not in pain I recommend palliative care/hospice service to manage her pain in the future

## 2020-03-18 ENCOUNTER — Telehealth: Payer: Self-pay | Admitting: Hematology and Oncology

## 2020-03-18 NOTE — Telephone Encounter (Signed)
Per 10/4 los, no changes made to pt schedule

## 2020-03-28 ENCOUNTER — Other Ambulatory Visit: Payer: Self-pay

## 2020-03-28 ENCOUNTER — Emergency Department (HOSPITAL_COMMUNITY): Payer: Medicare Other

## 2020-03-28 ENCOUNTER — Emergency Department (HOSPITAL_COMMUNITY)
Admission: EM | Admit: 2020-03-28 | Discharge: 2020-03-28 | Disposition: A | Discharge status: Home / Self Care | Payer: Medicare Other | Attending: Emergency Medicine | Admitting: Emergency Medicine

## 2020-03-28 DIAGNOSIS — R531 Weakness: Secondary | ICD-10-CM | POA: Diagnosis not present

## 2020-03-28 DIAGNOSIS — C189 Malignant neoplasm of colon, unspecified: Secondary | ICD-10-CM | POA: Insufficient documentation

## 2020-03-28 DIAGNOSIS — Z79891 Long term (current) use of opiate analgesic: Secondary | ICD-10-CM | POA: Diagnosis not present

## 2020-03-28 DIAGNOSIS — F0390 Unspecified dementia without behavioral disturbance: Secondary | ICD-10-CM | POA: Insufficient documentation

## 2020-03-28 DIAGNOSIS — L89150 Pressure ulcer of sacral region, unstageable: Secondary | ICD-10-CM | POA: Diagnosis not present

## 2020-03-28 DIAGNOSIS — R404 Transient alteration of awareness: Secondary | ICD-10-CM | POA: Diagnosis not present

## 2020-03-28 DIAGNOSIS — Z7401 Bed confinement status: Secondary | ICD-10-CM | POA: Diagnosis not present

## 2020-03-28 DIAGNOSIS — L89153 Pressure ulcer of sacral region, stage 3: Secondary | ICD-10-CM | POA: Insufficient documentation

## 2020-03-28 DIAGNOSIS — R109 Unspecified abdominal pain: Secondary | ICD-10-CM | POA: Diagnosis present

## 2020-03-28 DIAGNOSIS — M6281 Muscle weakness (generalized): Secondary | ICD-10-CM | POA: Diagnosis not present

## 2020-03-28 DIAGNOSIS — L98492 Non-pressure chronic ulcer of skin of other sites with fat layer exposed: Secondary | ICD-10-CM | POA: Diagnosis not present

## 2020-03-28 DIAGNOSIS — E785 Hyperlipidemia, unspecified: Secondary | ICD-10-CM | POA: Diagnosis not present

## 2020-03-28 DIAGNOSIS — L98422 Non-pressure chronic ulcer of back with fat layer exposed: Secondary | ICD-10-CM

## 2020-03-28 DIAGNOSIS — C785 Secondary malignant neoplasm of large intestine and rectum: Secondary | ICD-10-CM | POA: Diagnosis not present

## 2020-03-28 DIAGNOSIS — Z743 Need for continuous supervision: Secondary | ICD-10-CM | POA: Diagnosis not present

## 2020-03-28 DIAGNOSIS — M2550 Pain in unspecified joint: Secondary | ICD-10-CM | POA: Diagnosis not present

## 2020-03-28 DIAGNOSIS — K5900 Constipation, unspecified: Secondary | ICD-10-CM | POA: Diagnosis not present

## 2020-03-28 DIAGNOSIS — C19 Malignant neoplasm of rectosigmoid junction: Secondary | ICD-10-CM

## 2020-03-28 DIAGNOSIS — Z515 Encounter for palliative care: Secondary | ICD-10-CM

## 2020-03-28 DIAGNOSIS — Z993 Dependence on wheelchair: Secondary | ICD-10-CM | POA: Diagnosis not present

## 2020-03-28 LAB — CBC WITH DIFFERENTIAL/PLATELET
Abs Immature Granulocytes: 0.01 10*3/uL (ref 0.00–0.07)
Basophils Absolute: 0 10*3/uL (ref 0.0–0.1)
Basophils Relative: 1 %
Eosinophils Absolute: 0.1 10*3/uL (ref 0.0–0.5)
Eosinophils Relative: 3 %
HCT: 34.1 % — ABNORMAL LOW (ref 36.0–46.0)
Hemoglobin: 10.2 g/dL — ABNORMAL LOW (ref 12.0–15.0)
Immature Granulocytes: 0 %
Lymphocytes Relative: 23 %
Lymphs Abs: 1.3 10*3/uL (ref 0.7–4.0)
MCH: 26.4 pg (ref 26.0–34.0)
MCHC: 29.9 g/dL — ABNORMAL LOW (ref 30.0–36.0)
MCV: 88.3 fL (ref 80.0–100.0)
Monocytes Absolute: 0.4 10*3/uL (ref 0.1–1.0)
Monocytes Relative: 8 %
Neutro Abs: 3.6 10*3/uL (ref 1.7–7.7)
Neutrophils Relative %: 65 %
Platelets: 426 10*3/uL — ABNORMAL HIGH (ref 150–400)
RBC: 3.86 MIL/uL — ABNORMAL LOW (ref 3.87–5.11)
RDW: 14.5 % (ref 11.5–15.5)
WBC: 5.5 10*3/uL (ref 4.0–10.5)
nRBC: 0 % (ref 0.0–0.2)

## 2020-03-28 LAB — COMPREHENSIVE METABOLIC PANEL
ALT: 14 U/L (ref 0–44)
AST: 17 U/L (ref 15–41)
Albumin: 2.9 g/dL — ABNORMAL LOW (ref 3.5–5.0)
Alkaline Phosphatase: 52 U/L (ref 38–126)
Anion gap: 13 (ref 5–15)
BUN: 10 mg/dL (ref 8–23)
CO2: 25 mmol/L (ref 22–32)
Calcium: 8.8 mg/dL — ABNORMAL LOW (ref 8.9–10.3)
Chloride: 101 mmol/L (ref 98–111)
Creatinine, Ser: 0.7 mg/dL (ref 0.44–1.00)
GFR, Estimated: >60 mL/min (ref >60)
Glucose, Bld: 102 mg/dL — ABNORMAL HIGH (ref 70–99)
Potassium: 3.6 mmol/L (ref 3.5–5.1)
Sodium: 139 mmol/L (ref 135–145)
Total Bilirubin: 0.7 mg/dL (ref 0.3–1.2)
Total Protein: 7.6 g/dL (ref 6.5–8.1)

## 2020-03-28 MED ORDER — HYDROCODONE-ACETAMINOPHEN 5-325 MG PO TABS
1.0000 | ORAL_TABLET | Freq: Once | ORAL | Status: AC
  Administered 2020-03-28: 1 via ORAL
  Filled 2020-03-28: qty 1

## 2020-03-28 MED ORDER — ACETAMINOPHEN 325 MG PO TABS: 650.0000 mg | ORAL_TABLET | Freq: Once | ORAL | Status: DC

## 2020-03-28 NOTE — ED Notes (Signed)
[facility] called and pt and daughter given d/c papers.  Pt cleared to go, but consult placed to have family  reconvene with palliative care regarding expectations.

## 2020-03-28 NOTE — Progress Notes (Signed)
PMT no charge note  Call received and discussed with ED Colleague Dr Ashok Cordia regarding Rhonda Peterson, discussed about her life limiting illness of metastatic cancer and her current condition. Patient is from a facility, she sees Dr Alvy Bimler for her oncologic care.  Chart reviewed.  I reached out to [facility] and Palliative liaison who is on call this evening, Rhonda Peterson. I discussed with her about the patient. Patient identifiers given.  Plan: Patient is being d/c back to her facility, [facility] hosp[facility] and palliative will follow up with patient and family on 03-29-20 for ongoing goals of care discussions.   No charge [place] [phone].

## 2020-03-28 NOTE — ED Triage Notes (Signed)
              Pt arrived from [facility] with complaints of abdominal distention and pain to her bottom.  Hx of untreated  Cervical cancer and ongoing impaction.  Pt is under palliative care.  Daughter requested pt to come into hospital

## 2020-03-28 NOTE — ED Notes (Signed)
Mepelex patch applied to sacral wound.
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
## 2020-03-28 NOTE — Discharge Instructions (Addendum)
It was our pleasure to provide your ER care today - we hope that you feel better.  Follow up closely with your primary care doctor and palliative care team this week for follow up and to help further goals of care.   Keep pressure off sacral area as much as possible. Decubitus precautions. Clean wound and change dressing daily.  Return to ER if worse, new symptoms, fevers, severe or  intractable pain, persistent vomiting, or other concern.

## 2020-03-29 DIAGNOSIS — L89329 Pressure ulcer of left buttock, unspecified stage: Secondary | ICD-10-CM | POA: Diagnosis not present

## 2020-03-29 DIAGNOSIS — R180 Malignant ascites: Secondary | ICD-10-CM | POA: Diagnosis not present

## 2020-03-29 NOTE — Progress Notes (Signed)
  [facility])  Received request from Dr. Rowe Pavy for hospice services at [facility] after discharge.   This pt was referred to [facility] in early October and deemed eligible by our [facility] MD.  She had a consultation with our admission RN on 03/19/20 and her daughter declined admission to hospice, citing desire for second opinion.  This pt was referred at that time to [facility] community palliative services.   [facility] will reach out to pt's daughter again about hospice services for her mom.  [place] send signed and completed DNR home with pt/family.  Please provide prescriptions at discharge as needed to ensure ongoing symptom management.    Thank you for the opportunity to participate in this pt's care.  Domenic Moras, BSN, RN [employer] [phone] (24h on call)

## 2020-05-12 DEATH — deceased

## 2022-01-17 IMAGING — CT CT ABD-PELV W/ CM
2 of 5 series · 14 of 46 positions shown, 16 images · IV contrast (omnipaque)
Comparison: None.

CLINICAL DATA: Abdominal pain, acute nonlocalized. Distended.
Concern for bowel obstruction.

EXAM:
CT ABDOMEN AND PELVIS WITH CONTRAST
TECHNIQUE: Multidetector CT imaging of the abdomen and pelvis was performed
using the standard protocol following bolus administration of
intravenous contrast.
CONTRAST:  100mL OMNIPAQUE IOHEXOL 300 MG/ML  SOLN

[Series 2: axial st · axial · 0.79mm/px · z∈[+1019,+1429]mm · 11 of 94 slices shown, 13 images]
[im 6/94  soft-tissue]
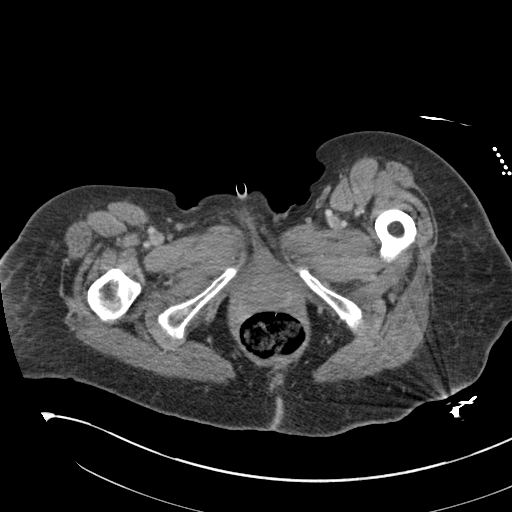
[im 6/94  bone]
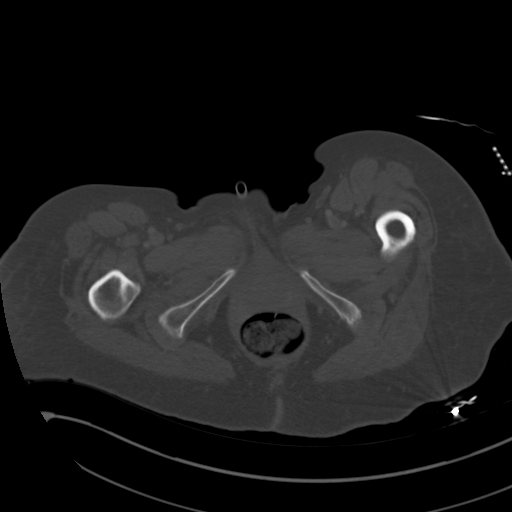
[im 18/94  soft-tissue]
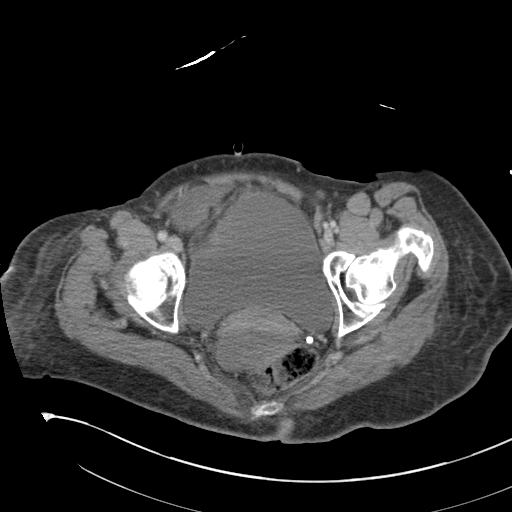
[im 24/94  soft-tissue]
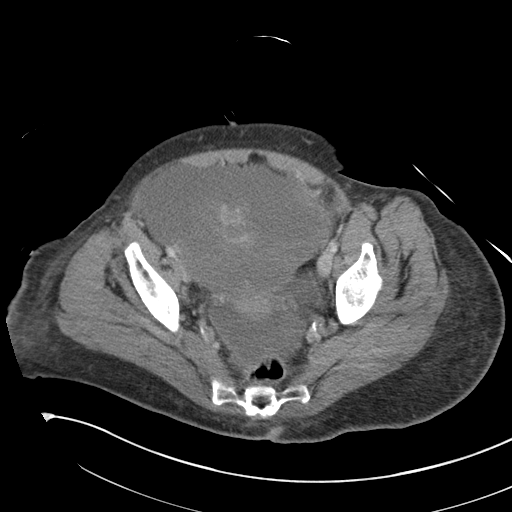
[im 30/94  soft-tissue]
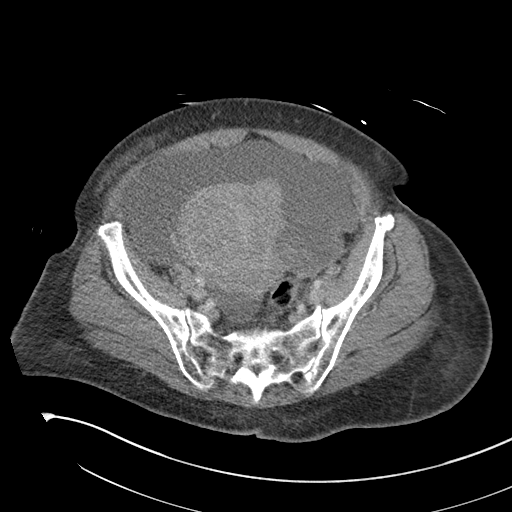
[im 41/94  soft-tissue]
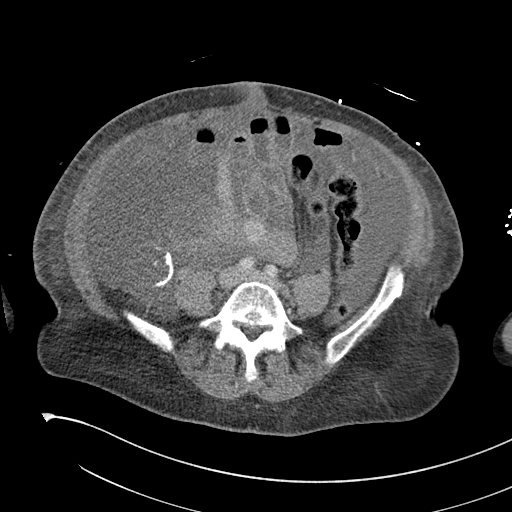
[im 47/94  soft-tissue]
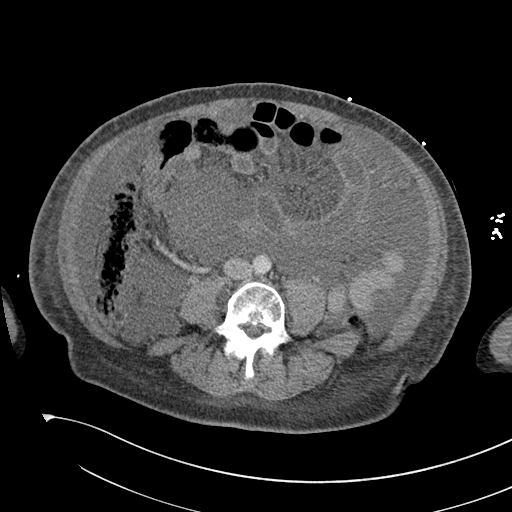
[im 53/94  soft-tissue]
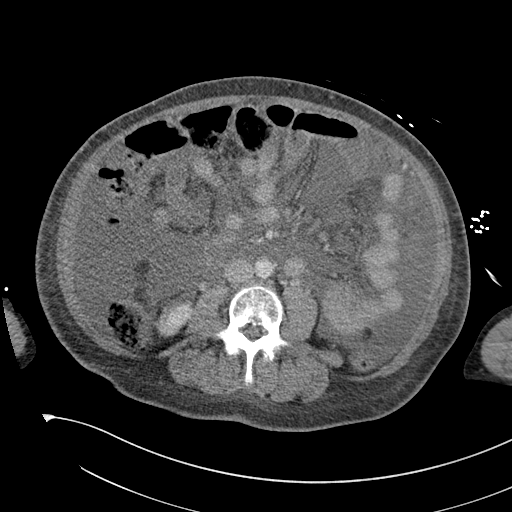
[im 64/94  soft-tissue]
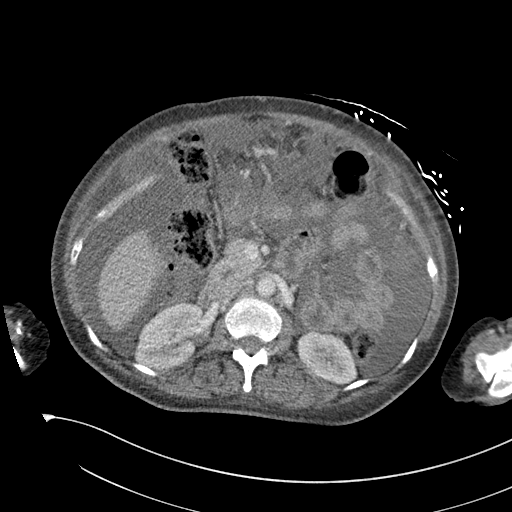
[im 70/94  soft-tissue]
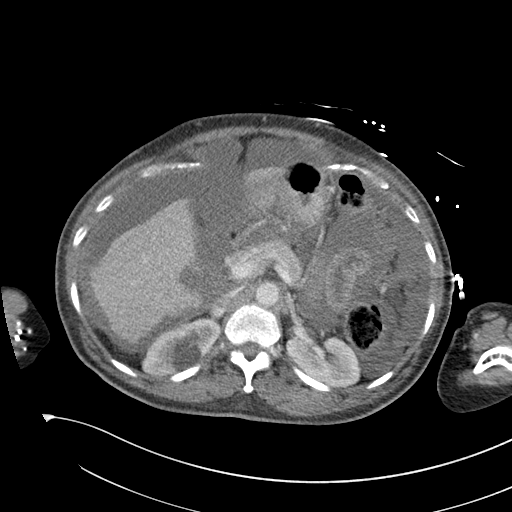
[im 70/94  bone]
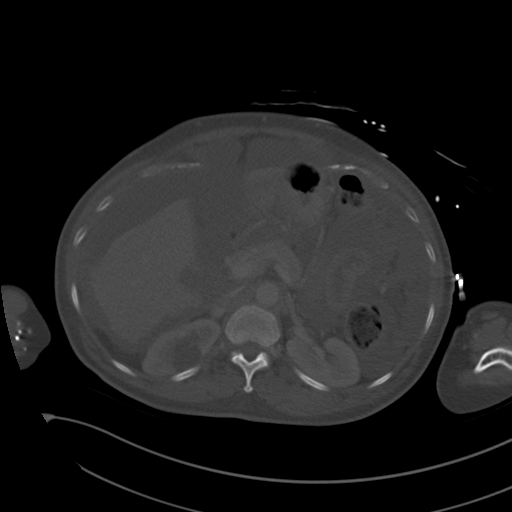
[im 76/94  soft-tissue]
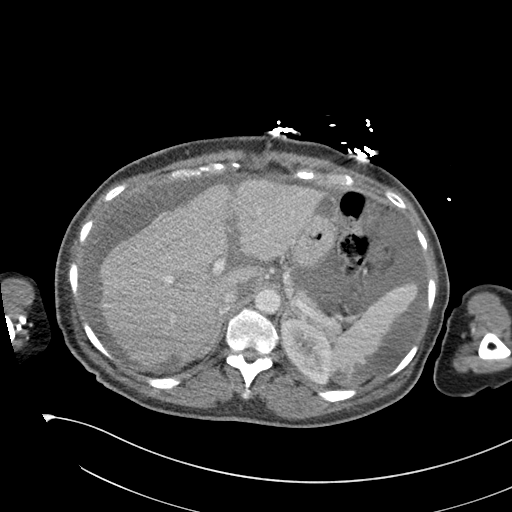
[im 88/94  soft-tissue]
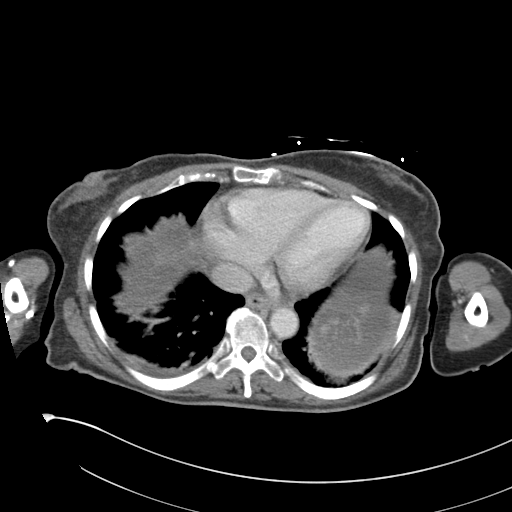

[Series 4: coronal st · coronal · 0.72mm/px · 3 of 144 slices shown]
[im 48/144  soft-tissue]
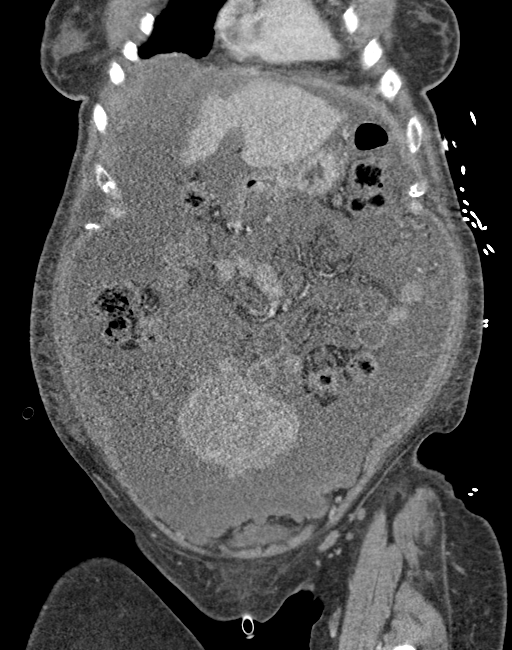
[im 64/144  soft-tissue]
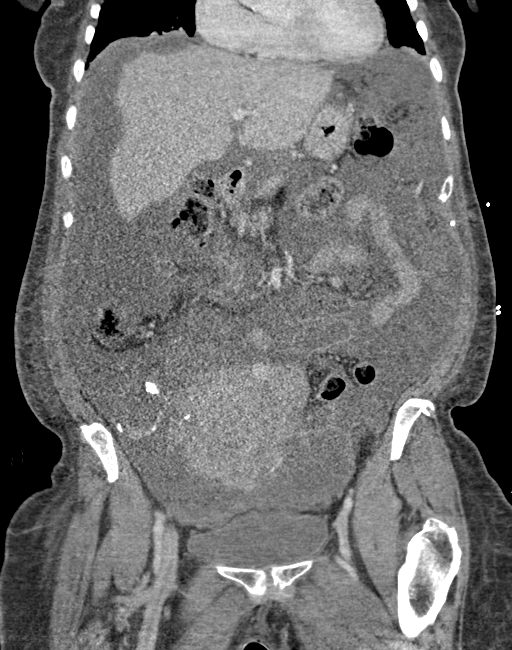
[im 80/144  soft-tissue]
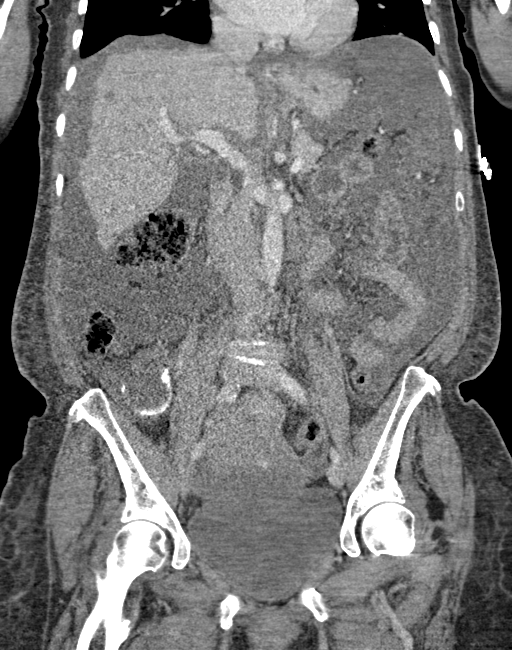

[14 of 46 positions shown; findings below may reference images not displayed]

FINDINGS: Lower chest: Trace bilateral, right greater than left, pleural
effusions. Bilateral lower lobe subsegmental atelectasis.

Hepatobiliary: Scalloped hepatic contour. Indeterminate 1.5 cm
hypodensity within the right hepatic lobe ([DATE]).

Pancreas: Unremarkable. No pancreatic ductal dilatation or
surrounding inflammatory changes.

Spleen: Irregular and scalloped appearing peripheral splenic contour
with several subcapsular lesions. Normal in size.

Adrenals/Urinary Tract: No adrenal nodule bilaterally. Bilateral
kidneys enhance and excrete symmetrically. Fluid density lesions
within the right kidney measures up to 3 cm and likely represents a
simple renal cyst. No hydronephrosis. No hydroureter. The urinary
bladder is unremarkable. On delayed imaging, there is no urothelial
wall thickening and there are no filling defects in the opacified
portions of the bilateral collecting systems or ureters.

Stomach/Bowel: Stomach is within normal limits. The appendix is not
definitely identified. No evidence of bowel wall thickening,
distention, or inflammatory changes.

Vascular/Lymphatic: The hepatic, portal, splenic, superior
mesenteric veins are patent., no abdominal aorta or iliac aneurysm.
Mild atherosclerotic plaque of the aorta and its branches. No
abdominal, pelvic, or inguinal lymphadenopathy.

Reproductive:

Irregular-appearing/scalloped anterior wall of the uterus ([DATE]).

Enlarging globular appearing uterus with a coarsely calcified lesion
likely representing a degenerative fibroid. Multiple other
hyperdense lesions within the uterus likely represent uterine
fibroids. The endometrium is not visualized and likely distorted.

The ovaries are not definitely visualized; however, there is a
approximately 5.5 x4 cm peripherally calcified lesion within the
right adnexal region that is concerning for an ovarian mass ([DATE]).

Other: Moderate to large volume simple free fluid ascites.
High-density material is noted within the omentum ([DATE]). Soft
tissue density noted within the ileocolic mesentery ([DATE]). Soft
tissue density along within the mesentery along the left paracolic
gutter measuring up to 2.7 cm ([DATE]). No pneumoperitoneum.

Musculoskeletal:

Small fluid containing umbilical hernia with an abdominal defect of
approximately 1.6 cm. Mild subcutaneus soft tissue edema.

No suspicious lytic or blastic osseous lesions. No acute displaced
fracture. Multilevel degenerative changes of the spine.
IMPRESSION: 1. Constellation of findings concerning for ovarian malignancy with
diffuse peritoneal carcinomatosis, omental caking, as well as
hepatic, splenic, and uterine metastases. Peripherally calcified
cm right adnexal lesion concerning for the primary ovarian neoplasm.
2. Moderate to large simple free fluid ascites.
3. Uterine lesions likely represent leiomyoma.
4. Bilateral trace pleural effusions.
5.  Aortic Atherosclerosis ([id number]).
6. No bowel obstruction.

These results were called by telephone at the time of interpretation
on 03/04/2020 at [DATE] to provider RRAGO HOFFMAN , who verbally
acknowledged these results.

## 2022-01-23 IMAGING — US US PARACENTESIS
1 series · 6 of 6 positions shown · non-contrast
Comparison: none

INDICATION: Patient with history of dementia, and recent imaging findings of
diffuse peritoneal carcinomatosis, omental caking, ascites, hepatic,
splenic and uterine metastases, right adnexal lesion concerning for
primary ovarian neoplasm; request received for diagnostic and
therapeutic paracentesis.

[Series 1: us paracentesis · 6 of 6 slices shown]
[im 1/6]
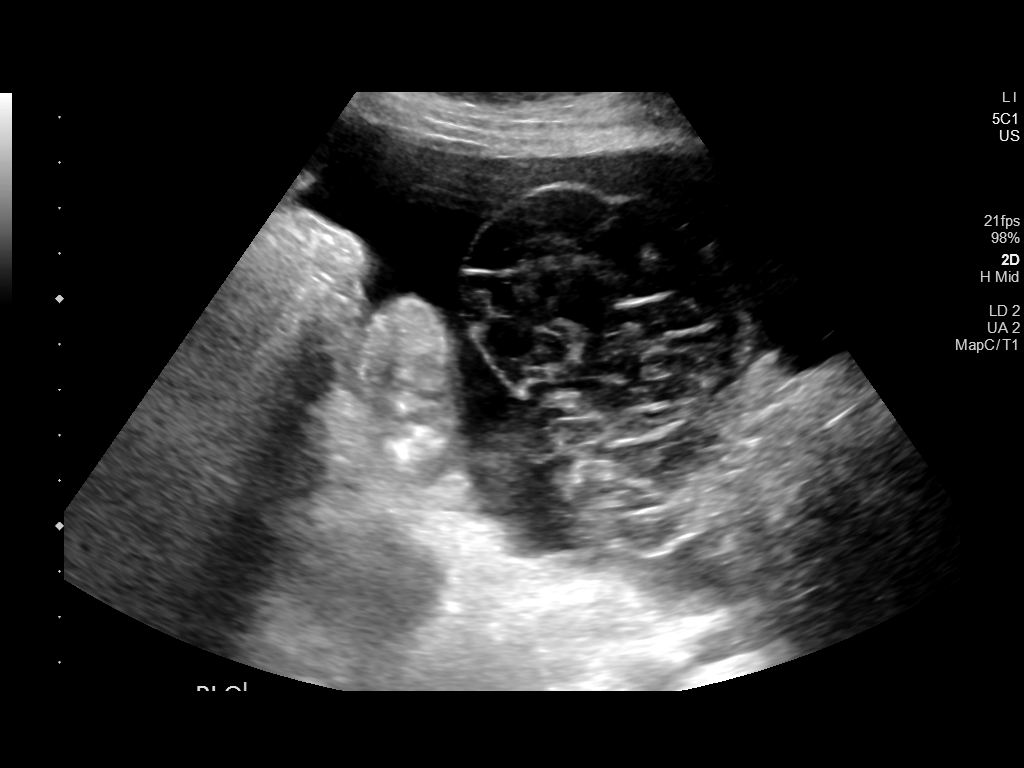
[im 2/6]
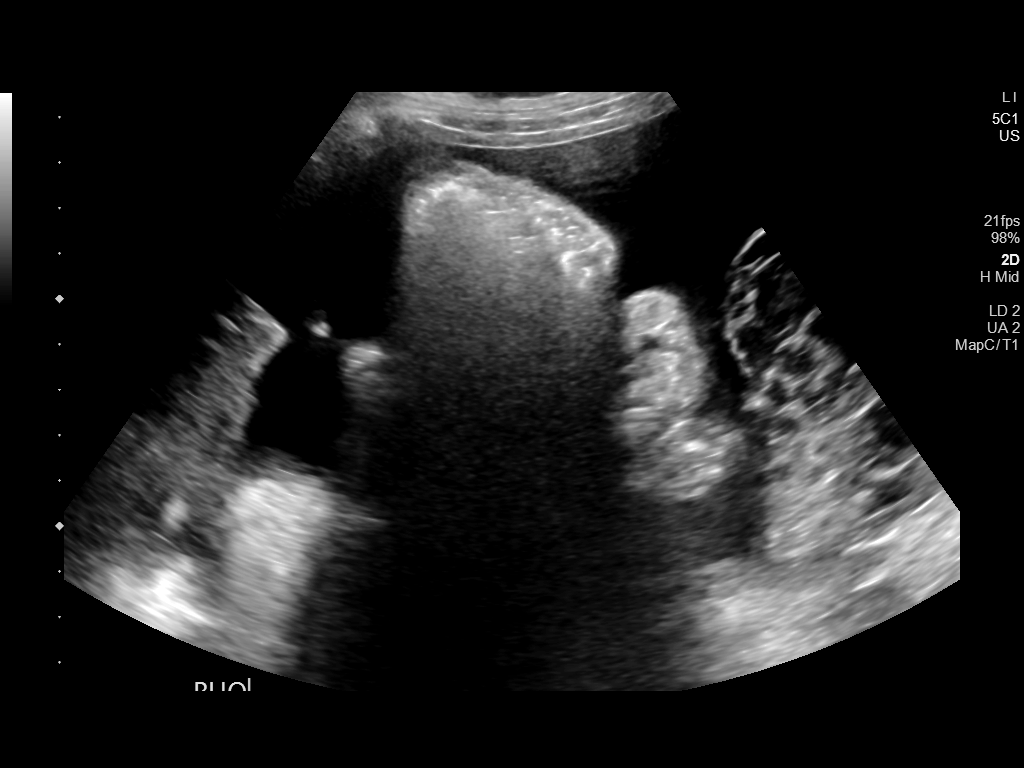
[im 3/6]
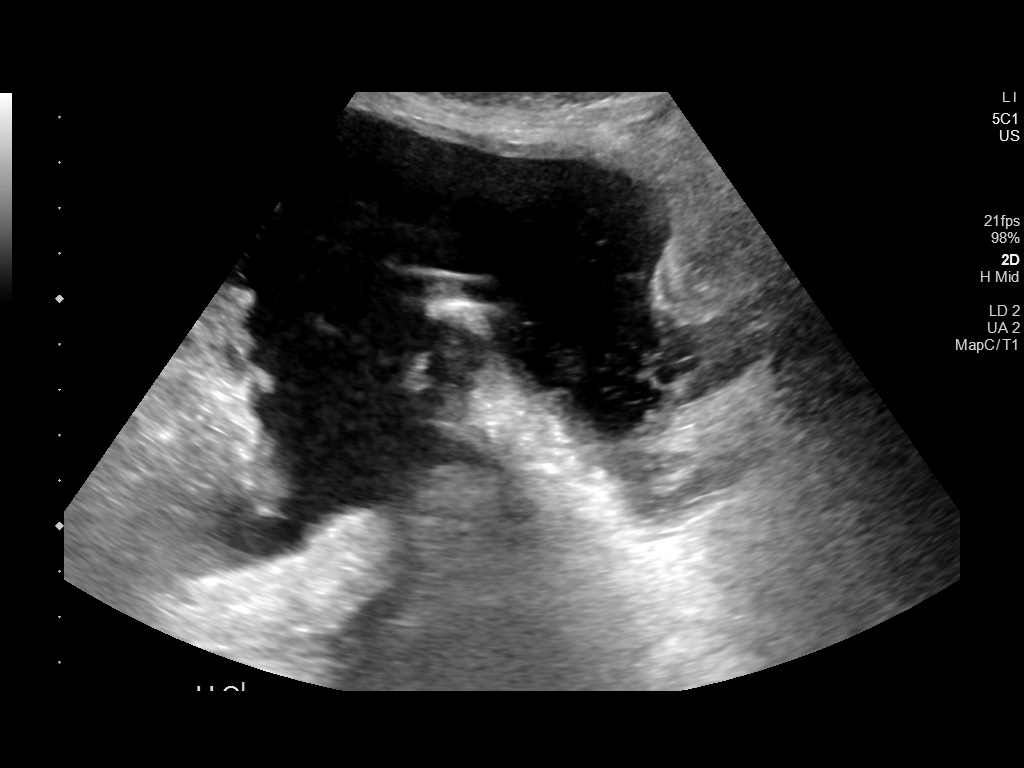
[im 4/6]
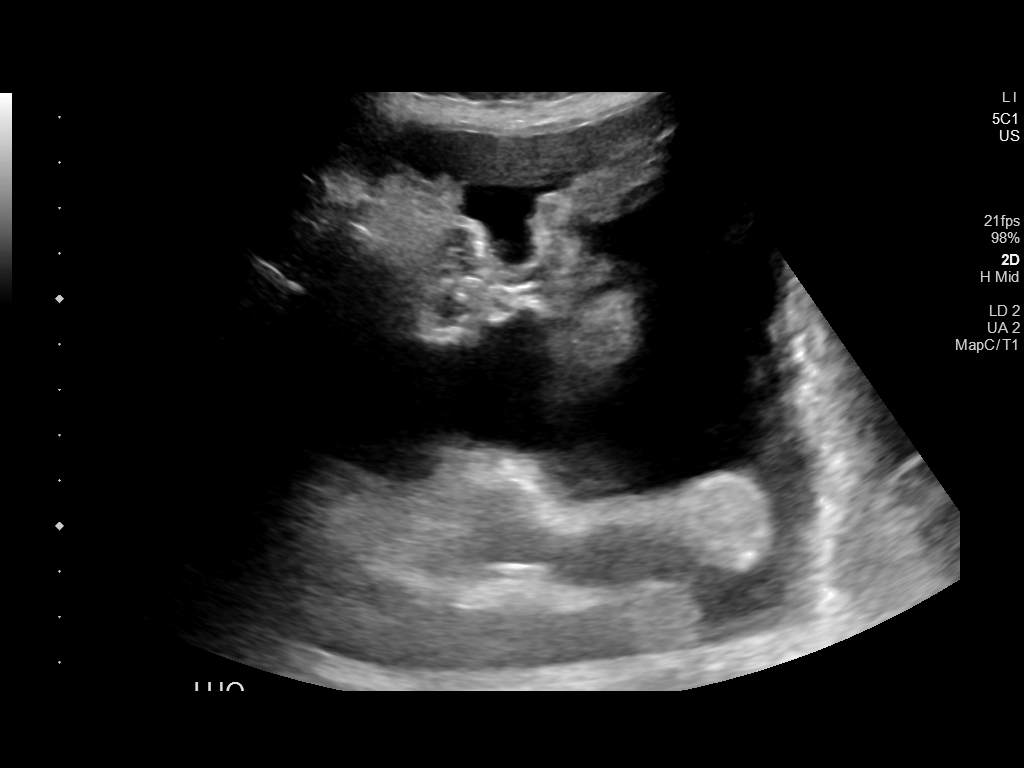
[im 5/6]
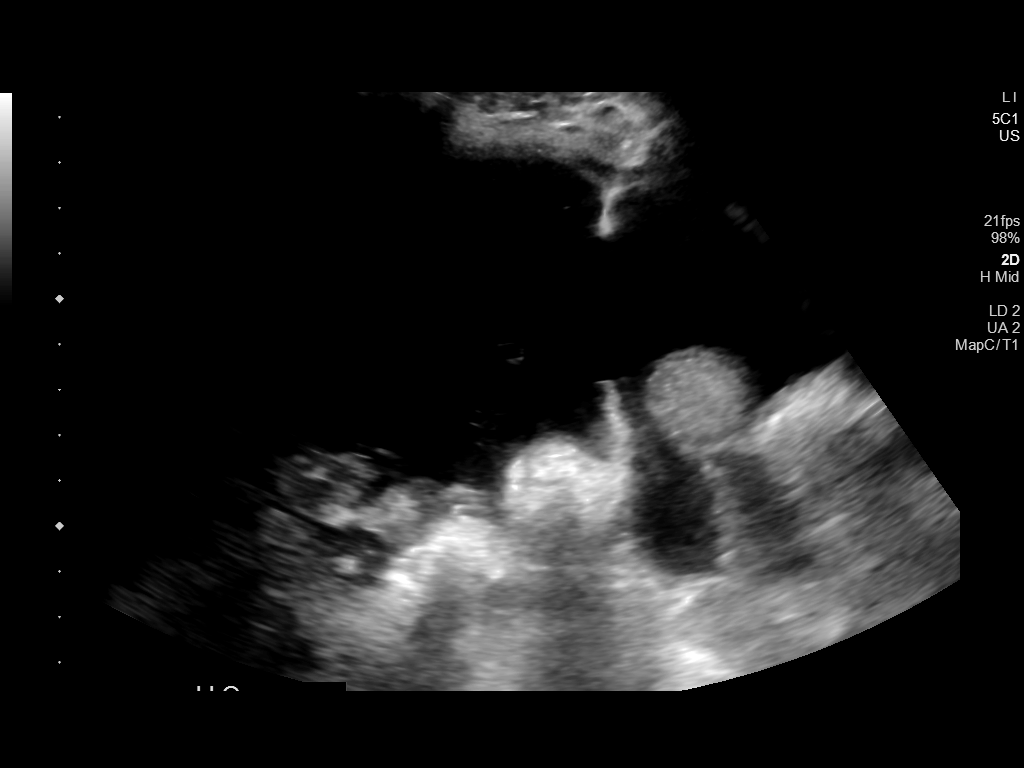
[im 6/6]
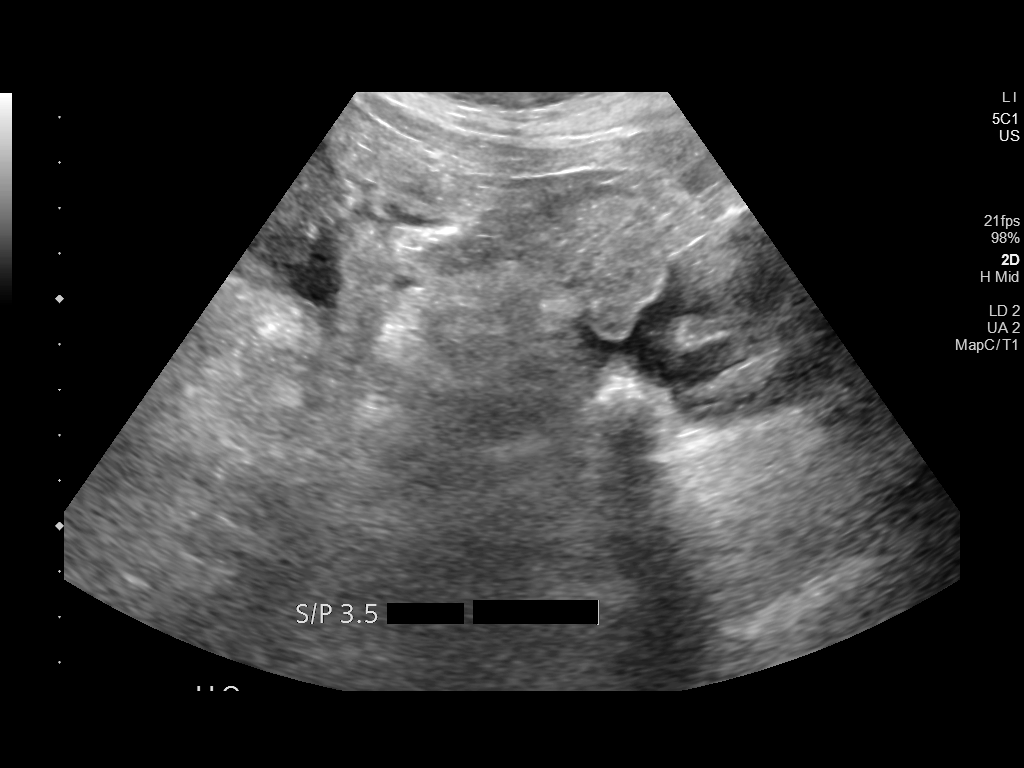

[6 of 6 positions shown; findings below may reference images not displayed]

EXAM:
ULTRASOUND GUIDED DIAGNOSTIC AND THERAPEUTIC PARACENTESIS

MEDICATIONS:
1% lidocaine to skin and subcutaneous tissue

COMPLICATIONS:
None immediate.

PROCEDURE:
Informed written consent was obtained from the patient's daughter
after a discussion of the risks, benefits and alternatives to
treatment. A timeout was performed prior to the initiation of the
procedure.

Initial ultrasound scanning demonstrates a moderate to large amount
of ascites within the left mid to lower abdominal quadrant. The left
mid to lower abdomen was prepped and draped in the usual sterile
fashion. 1% lidocaine was used for local anesthesia.

Following this, a 6 Fr Safe-T-Centesis catheter was introduced. An
ultrasound image was saved for documentation purposes. The
paracentesis was performed. The catheter was removed and a dressing
was applied. The patient tolerated the procedure well without
immediate post procedural complication.
FINDINGS: A total of approximately 3.5 liters of yellow fluid was removed.
Samples were sent to the laboratory as requested by the clinical
team.
IMPRESSION: Successful ultrasound-guided diagnostic and therapeutic paracentesis
yielding 3.5 liters of peritoneal fluid.
